# Patient Record
Sex: Male | Born: 1980 | Race: White | Hispanic: No
Health system: Southern US, Community
[De-identification: ages and names within clinical notes are randomized; demographics above are authoritative.]

---
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
## 2001-08-22 ENCOUNTER — Inpatient Hospital Stay (HOSPITAL_COMMUNITY): Admission: EM | Admit: 2001-08-22 | Discharge: 2001-08-24 | Payer: Self-pay | Admitting: Emergency Medicine

## 2001-08-22 ENCOUNTER — Encounter: Payer: Self-pay | Admitting: Emergency Medicine

## 2001-08-23 ENCOUNTER — Encounter: Payer: Self-pay | Admitting: Internal Medicine

## 2001-08-24 ENCOUNTER — Encounter: Payer: Self-pay | Admitting: Internal Medicine

## 2001-08-26 ENCOUNTER — Encounter: Admission: RE | Admit: 2001-08-26 | Discharge: 2001-08-26 | Payer: Self-pay | Admitting: Internal Medicine

## 2002-04-29 ENCOUNTER — Emergency Department (HOSPITAL_COMMUNITY): Admission: EM | Admit: 2002-04-29 | Discharge: 2002-04-29 | Payer: Self-pay | Admitting: Emergency Medicine

## 2003-09-04 ENCOUNTER — Emergency Department (HOSPITAL_COMMUNITY): Admission: AD | Admit: 2003-09-04 | Discharge: 2003-09-04 | Payer: Self-pay | Admitting: Emergency Medicine

## 2003-09-04 ENCOUNTER — Encounter: Payer: Self-pay | Admitting: Emergency Medicine

## 2004-06-28 ENCOUNTER — Emergency Department (HOSPITAL_COMMUNITY): Admission: EM | Admit: 2004-06-28 | Discharge: 2004-06-28 | Payer: Self-pay | Admitting: Emergency Medicine

## 2005-06-09 ENCOUNTER — Emergency Department (HOSPITAL_COMMUNITY): Admission: EM | Admit: 2005-06-09 | Discharge: 2005-06-09 | Payer: Self-pay | Admitting: Emergency Medicine

## 2005-06-13 ENCOUNTER — Emergency Department (HOSPITAL_COMMUNITY): Admission: EM | Admit: 2005-06-13 | Discharge: 2005-06-13 | Payer: Self-pay | Admitting: Emergency Medicine

## 2005-06-16 ENCOUNTER — Emergency Department (HOSPITAL_COMMUNITY): Admission: EM | Admit: 2005-06-16 | Discharge: 2005-06-16 | Payer: Self-pay | Admitting: Emergency Medicine

## 2005-09-01 ENCOUNTER — Emergency Department (HOSPITAL_COMMUNITY): Admission: EM | Admit: 2005-09-01 | Discharge: 2005-09-01 | Payer: Self-pay | Admitting: *Deleted

## 2006-07-31 ENCOUNTER — Emergency Department (HOSPITAL_COMMUNITY): Admission: EM | Admit: 2006-07-31 | Discharge: 2006-07-31 | Payer: Self-pay | Admitting: Emergency Medicine

## 2007-06-15 IMAGING — CR DG WRIST COMPLETE 3+V*R*
4 series · 4 of 4 positions shown · non-contrast
Comparison: none

CLINICAL DATA: Wrist laceration with bleeding right wrist.
 RIGHT WRIST ? 4 VIEWS:
 There is some soft tissue irregularity medially with overlying bandages compatible with the given history of laceration.  No foreign body, acute fracture, or dislocation is seen.

[x wrist pa right]
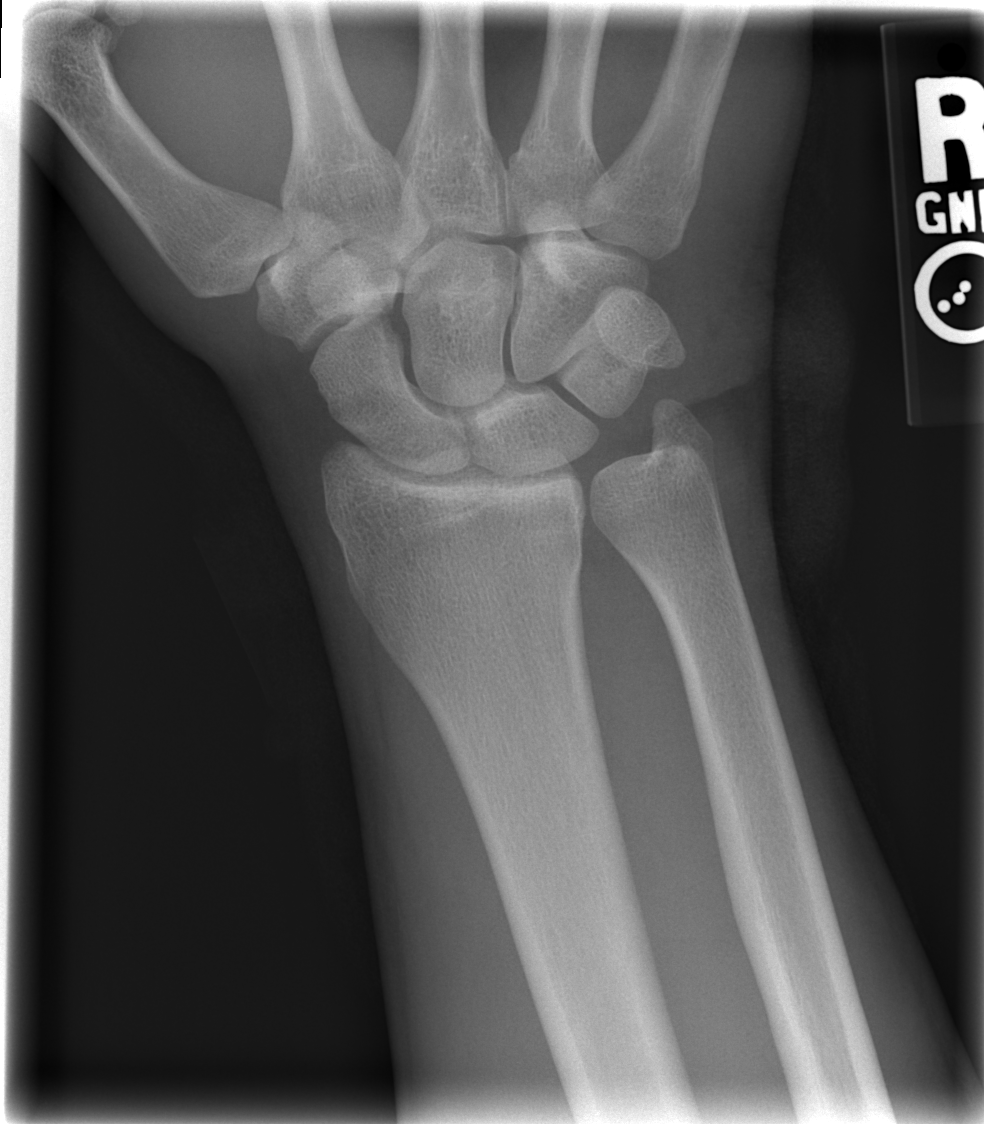

[x wrist obl right]
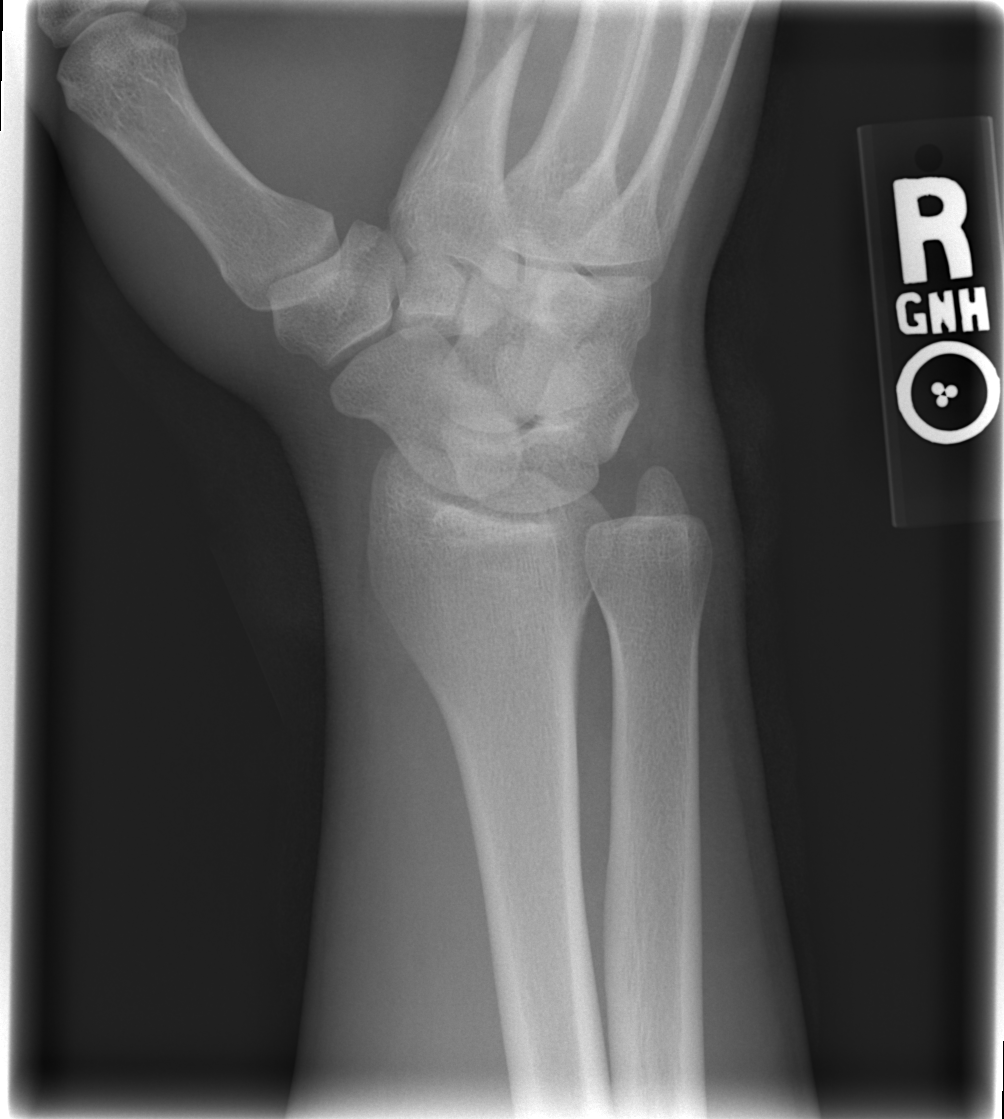

[x wrist lat right]
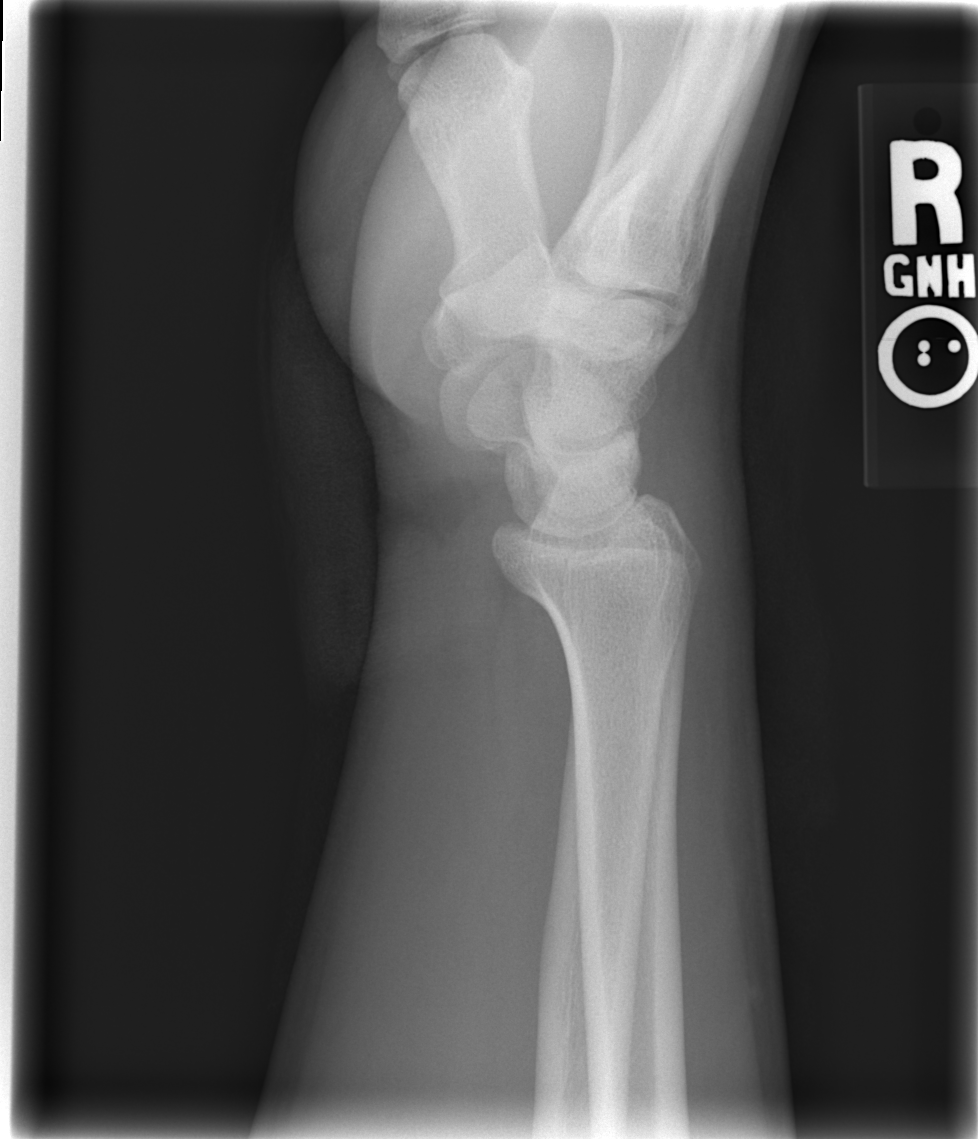

[x navicular]
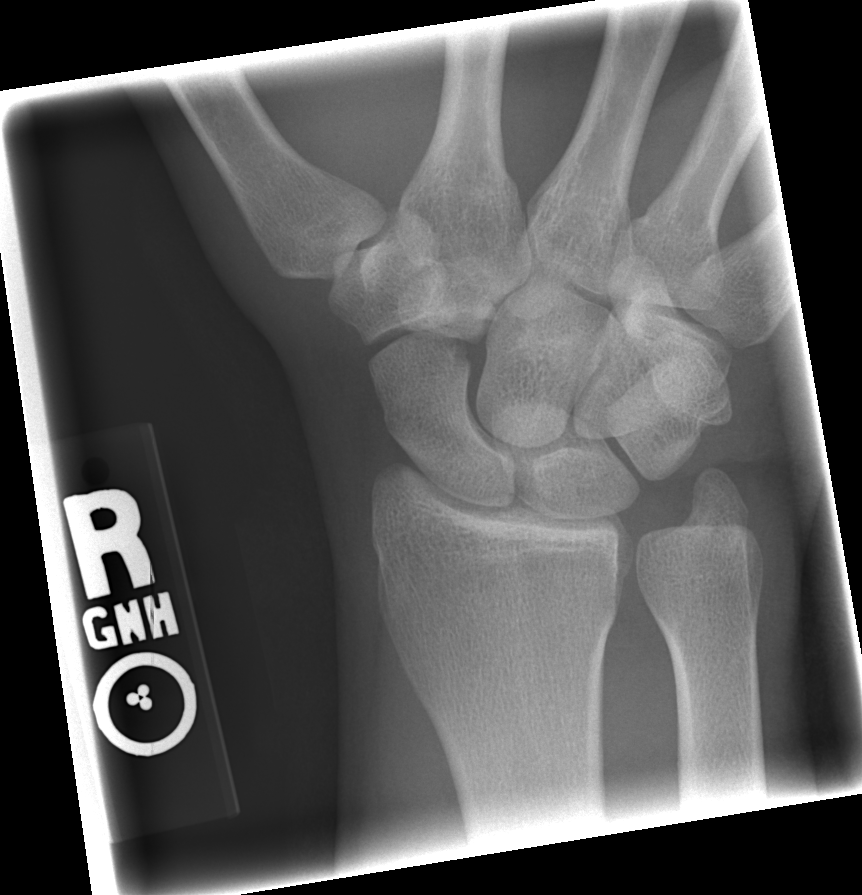

[4 of 4 positions shown; findings below may reference images not displayed]

IMPRESSION: No acute osseous findings or evidence of foreign body.
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
## 2018-04-11 NOTE — Progress Notes (Signed)
Functional Indep Measure Scores - Text       Functional Independence Measure Scores Entered On:  04/11/2018 14:41 EDT    Performed On:  04/11/2018 14:40 EDT by Kyung Rudd, RN, MANUVETTE E               Eating Score   Patient's Independence Level With Eating Tasks :   Setup/Supervision   Supervision or Setup Needed :   Cut food, Open containers, Pour liquid into containers, Prepare food for eating   Functional Independence Measure Eating :   Supervision or setup - 5   KENNEDY, RN, MANUVETTE E - 04/11/2018 14:40 EDT   Bladder Management Score   Patient's Independence Level with Bladder Management Tasks :   Assistance   Device/Techniques Utilized :   Catheter   Type of Assistance Necessary - Catheter :   Helper manages schedule   Functional Independence Measure Bladder Management :   Total assistance - 1   KENNEDY, RN, MANUVETTE E - 04/11/2018 14:40 EDT   Bowel Management Score   Patient's Independence Level with Bowel Management Tasks :   Assistance   Devices/Techniques Utilized :   Digital stimulation   Type of Assistance Necessary -   Digital Stimulation :   Helper changes linen or clothing/cleans up spill   Functional Independence Measure Bowel Management :   Total assistance - 1   KENNEDY, RN, MANUVETTE E - 04/11/2018 14:40 EDT   Admission Information   Swallowing Status on Admission :   Regular food   KENNEDY, RN, MANUVETTE E - 04/11/2018 14:41 EDT

## 2018-04-11 NOTE — Progress Notes (Signed)
Chaplaincy Note - Text       Chaplaincy Note Entered On:  04/11/2018 16:09 EDT    Performed On:  04/11/2018 16:09 EDT by Erenest Blank               Chaplaincy Consult   Faith/Denomination :   [facility]   Additional Information, Chaplaincy :   Attempted visit, pt not in room. Will f/u for spiritual assessment   Erenest Blank - 04/11/2018 16:09 EDT

## 2018-04-11 NOTE — Nursing Note (Signed)
Adult Patient History Form-Text       Adult Patient History Entered On:  04/11/2018 13:33 EDT    Performed On:  04/11/2018 13:30 EDT by Kyung Rudd, RN, [place]UVET[place] E               General Info   Patient Identified :   Identification band   Accompanied By :   Parent, Sibling   Pregnancy Status :   N/A   KENNEDY, RN, [place]UVET[place] E - 04/11/2018 13:30 EDT   ID Risk Screen Symptoms   Recent Travel History :   No recent travel   TB Symptom Screen :   No symptoms   C. diff Symptom/History ID :   Neither of the above   MRSA/VRE Screening :   Admitted to [facility]   [place], RN, [place] E - 04/11/2018 13:30 EDT   Bloodless Medicine   Will Patient Accept Blood Transfusion and/or Blood Products :   Yes   KENNEDY, RN, [place] E - 04/11/2018 13:30 EDT   Nutrition   Nutritional Risk Factors :   None   KENNEDY, RN, [place]ET[place] E - 04/11/2018 13:30 EDT   Spiritual   Faith/Denomination :   Baptist   Do you have a concern that you would like to address with a Chaplain? :   Yes   Do you have any religious/spiritual/cultural beliefs that could impact the way your care is provided? :   No   KENNEDY, RN, [place] E - 04/11/2018 13:30 EDT   Advance Directive   Advance Directive :   Yes   Patient Wishes to Receive Further Information on Advance Directives :   No   KENNEDY, RN, [place] E - 04/11/2018 13:30 EDT   Valuables and Belongings   DCP GENERIC CODE   At Bedside :   Cell phone, Clothes   KENNEDY, RN, [place] E - 04/11/2018 13:30 EDT   Admission Complete   Admission Complete :   Yes-unable to obtain further information   Kyung Rudd, RN, [place] E - 04/11/2018 13:30 EDT

## 2018-04-12 NOTE — Progress Notes (Signed)
Functional Indep Measure Scores - Text       Functional Independence Measure Scores Entered On:  04/12/2018 12:53 EDT    Performed On:  04/12/2018 8:30 EDT by Kurtis Bushman L               Eating Score   Patient's Independence Level With Eating Tasks :   Setup/Supervision   Supervision or Setup Needed :   Open containers   Functional Independence Measure Eating :   Supervision or setup - 5   [name] [name] - 04/12/2018 12:48 EDT   Grooming Score   Patient's Independence Level with Grooming Tasks :   Setup/Supervision   Supervision or Setup Needed :   Gather equipment   Grooming :   Supervision or setup - 5   [name] [name] - 04/12/2018 12:48 EDT   Bathing Score   Patient's Independence Level with Bathing Tasks :   Assistance   Type of Assistance Necessary :   Assistance with bathing body parts   Body Parts Assessed :   All body surfaces   Tasks Requiring Assistance :   Buttocks, Lower leg and foot, left, Lower leg and foot, right, Upper leg, left, Upper leg, right   Functional Independence Measure Bathing :   Moderate assistance - 3   [name] [name] - 04/12/2018 12:48 EDT   Upper Body Dressing Score   Patient's Independence Level with Upper Body Dressing Tasks :   Assistance   Type of Assistance Necessary :   Assistance with dressing tasks   Assess Donning/Doffing :   Assess donning tasks only   Tasks Assessed - Donning :   Right sleeve, Left sleeve, Pull head through neckline, Pull over trunk   Tasks Requiring Assistance - Donning :   Pull over trunk   UE Dressing :   Minimal contact assistance - 4   [name] [name] - 04/12/2018 12:48 EDT   Lower Body Dressing Score   Patient's independence Level with Lower Body Dressing Tasks :   Assistance   Type of Assistance Necessary :   Assistance with dressing tasks   Assess Donning/Doffing :   Assess donning tasks only   Tasks Assessed - Donning :   Underwear - right leg, Underwear - left leg, Underwear - pull on, Pants/skirt - right leg, Pants/skirt - left leg,  Pants/skirt - pull on, Socks - right, Socks - left   Tasks Requiring Assistance - Donning :   Underwear - right leg, Underwear - left leg, Underwear - pull on, Pants/skirt - right leg, Pants/skirt - left leg, Pants/skirt - pull on, Socks - right, Socks - left   LE Dressing :   Total assistance - 1   [name] [name] - 04/12/2018 12:48 EDT   Toileting Score   Patient's Independence Level with Toileting Tasks :   Assistance   Type of Assistance Necessary :   Assistance with toileting tasks   Amount of Assistance Needed :   Adjusting clothing before toileting, Adjusting clothing after toileting, Cleansing perineal area   Toileting :   Total assistance - 1   [name] [name] - 04/12/2018 12:48 EDT   Transfer Bed/Chair/WC Score   Patient's independence Level with Bed, Chair, Wheelchair Tasks :   Assistance   Type of Assistance Necessary :   Mechanical lift required, Total assist (patient performs less than 25%)   Bed, Chair, Wheelchair Transfer :   Total assistance - 1  [name] [name] - 04/12/2018 12:48 EDT   Transfer Toilet Score   Patient's Independence Level with Transfer Toilet Tasks :   Assistance   Amount of Assistance Necessary :   Total assist (patient performs less than 25%)   Transfer Toilet :   Total assistance - 1   [name] [name] - 04/12/2018 12:48 EDT   Comprehension Score   Comprehends Complex or Abstract Information Without Prompting or Cueing :   Yes   Mode of Comprehension :   Auditory   Understands Complex or Abstract Directions and Conversations :   At all times   Functional Independence Measure Comprehension :   Complete independence - 7   [name] [name] - 04/12/2018 12:48 EDT   Expression Score   Expresses Complex or Abstract Information Without Prompting or Cueing :   Yes   Expression Mode :   Vocal   Expresses Complex or Abstract Ideas :   Clearly and fluently at all times   Functional Independence Measure Expression :   Complete independence - 7   [name] [name] - 04/12/2018 12:48  EDT   Social Interaction Score   Interacts Appropriately Without Supervision :   Yes   Interacts Appropriately :   At all times   Functional Independence Measure Social Interaction :   Complete independence - 7   [name] [name] - 04/12/2018 12:48 EDT   Problem Solving Score   Solves Complex Problems :   Yes   Ability to Solve Complex Problems :   Makes decisions with mild difficulty   Functional Independence Measure Problem Solving :   Modified independence - 6   [name] [name] - 04/12/2018 12:48 EDT   Memory Score   Recognizes, Remembers Routines, and Executes Requests Without Prompting :   Yes   Remembers and Executes Requests :   Mild difficulty   Functional Independence Measure Memory :   Modified independence - 6   [name] [name] - 04/12/2018 12:48 EDT
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
## 2018-04-12 NOTE — Nursing Note (Signed)
Medication Administration Follow Up-Text       Medication Administration Follow Up Entered On:  04/12/2018 8:56 EDT    Performed On:  04/12/2018 8:55 EDT by Ottie Glazier, RN, Amy J      Intervention Information:     oxycodone  Performed by Ottie Glazier, RN, Amy J on 04/12/2018 07:56:00 EDT       oxycodone,5mg   Oral,moderate pain (4-7)       Med Response   ED Medication Response :   No adverse reaction   Numeric Rating Pain Scale :   2   Varnum, RN, Amy J - 04/12/2018 8:55 EDT
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
## 2018-04-12 NOTE — Progress Notes (Signed)
Functional Indep Measure Scores - Text       Functional Independence Measure Scores Entered On:  04/12/2018 16:53 EDT    Performed On:  04/12/2018 16:52 EDT by [name], PT, [name]               Transfer Bed/Chair/WC Score   Patient's independence Level with Bed, Chair, Wheelchair Tasks :   Assistance   Type of Assistance Necessary :   Two helpers needed, Total assist (patient performs less than 25%)   Bed, Chair, Wheelchair Transfer :   Total assistance - 1   [place], [place] - 04/12/2018 16:52 EDT   Walk/Wheelchair Score   Mode of Locomotion Goal :   Wheelchair   Type of Wheelchair Goal :   Manual wheelchair   Mode of Locomotion on Discharge :   Wheelchair   Patient Ambulates :   Does not occur   InM Walk Ambulation Score :   -101 ft   Does Not Occur Reason :   Patient cannot perform activity because of a medical condition or medical treatment   Functional Independence Measure Walk :   Does not occur   Assessed Locomotion in a Wheelchair :   Yes   Distance Traveled in a Wheelchair :   300 ft   Amount of Assistance Necessary :   Cueing, Standby supervision   Functional Independence Measure Wheelchair :   Supervision or setup - 5   [place], [place], [place] C - 04/12/2018 16:52 EDT   Stairs Score   Stairs Assessed :   Does not occur   Does Not Occur Reason :   Patient cannot perform activity because of a medical condition or medical treatment   Functional Independence Measure Stairs :   Does not occur   [place] - 04/12/2018 16:52 EDT

## 2018-04-12 NOTE — Progress Notes (Signed)
Functional Indep Measure Scores - Text       Functional Independence Measure Scores Entered On:  04/12/2018 1:25 EDT    Performed On:  04/12/2018 1:24 EDT by Jena Gauss, LPN, APRIL T               Eating Score   Patient's Independence Level With Eating Tasks :   Independent/Modified independence   Independent or Modifications Needed, Uses or Applies Independently :   Complete independence   Functional Independence Measure Eating :   Complete independence - 7   [place], LPN, APRIL T - 4/0/9811 1:24 EDT   Upper Body Dressing Score   Patient's Independence Level with Upper Body Dressing Tasks :   Assistance   Type of Assistance Necessary :   Requires assistance of 2 people   UE Dressing :   Total assistance - 1   Upper Body Dressing Score Comments :   Per PCT FIM Sheet completed by Unice Bailey, RN, CATHERINE - 04/22/2018 11:17 EDT   Toileting Score   Patient's Independence Level with Toileting Tasks :   Assistance   Type of Assistance Necessary :   Requires assistance of 2 people   Toileting :   Total assistance - 1   MAXWE[place], LPN, APRIL T - 08/10/4781 1:24 EDT   Bladder Management Score   Patient's Independence Level with Bladder Management Tasks :   Assistance   Device/Techniques Utilized :   Catheter   Type of Assistance Necessary - Catheter :   Total assist (patient performs less than 25%)   Functional Independence Measure Bladder Management :   Total assistance - 1   MAXWELL, LPN, APRIL T - 08/14/6212 1:24 EDT   Bowel Management Score   Patient's Independence Level with Bowel Management Tasks :   Assistance   Devices/Techniques Utilized :   Enema/Suppository   Type of Assistance Necessary - Enema/Suppository :   Helper changes linen or clothing/cleans up spill   Functional Independence Measure Bowel Management :   Total assistance - 1   MAXWELL, LPN, APRIL T - 0/07/6577 1:24 EDT   Transfer Bed/Chair/WC Score   Patient's independence Level with Bed, Chair, Wheelchair Tasks :   Assistance   Type of Assistance  Necessary :   Two helpers needed   Bed, Chair, Wheelchair Transfer :   Total assistance - 1   MAXWELL, LPN, APRIL T - 03/15/9628 1:24 EDT
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
## 2018-04-12 NOTE — Progress Notes (Signed)
Functional Indep Measure Scores - Text       Functional Independence Measure Scores Entered On:  04/12/2018 13:00 EDT    Performed On:  04/12/2018 12:59 EDT by [name], SLP, MAGGIE S               Comprehension Score   Comprehends Complex or Abstract Information Without Prompting or Cueing :   Yes   Mode of Comprehension :   Auditory   Understands Complex or Abstract Directions and Conversations :   Mild difficulty   Functional Independence Measure Comprehension :   Modified independence - 6   LEWIS, SLP, MAGGIE S - 04/12/2018 12:59 EDT   Expression Score   Expresses Complex or Abstract Information Without Prompting or Cueing :   Yes   Expression Mode :   Vocal   Expresses Complex or Abstract Ideas :   Mild difficulty   Functional Independence Measure Expression :   Modified independence - 6   LEWIS, SLP, MAGGIE S - 04/12/2018 12:59 EDT   Social Interaction Score   Interacts Appropriately Without Supervision :   Yes   Interacts Appropriately :   At all times   Functional Independence Measure Social Interaction :   Complete independence - 7   LEWIS, SLP, MAGGIE S - 04/12/2018 12:59 EDT   Problem Solving Score   Solves Complex Problems :   Yes   Ability to Solve Complex Problems :   Makes decisions with mild difficulty   Functional Independence Measure Problem Solving :   Modified independence - 6   LEWIS, SLP, MAGGIE S - 04/12/2018 12:59 EDT   Memory Score   Recognizes, Remembers Routines, and Executes Requests Without Prompting :   Yes   Remembers and Executes Requests :   Mild difficulty   Functional Independence Measure Memory :   Modified independence - 6   LEWIS, SLP, MAGGIE S - 04/12/2018 12:59 EDT

## 2018-04-12 NOTE — Progress Notes (Signed)
Functional Indep Measure Scores - Text       Functional Independence Measure Scores Entered On:  04/12/2018 9:05 EDT    Performed On:  04/12/2018 9:02 EDT by Ottie Glazier, RN, Amy J               Eating Score   Patient's Independence Level With Eating Tasks :   Setup/Supervision   Supervision or Setup Needed :   Prepare food for eating   Functional Independence Measure Eating :   Supervision or setup - 5   [place], RN, Amy J - 04/12/2018 9:02 EDT   Bladder Management Score   Patient's Independence Level with Bladder Management Tasks :   Assistance   Device/Techniques Utilized :   Catheter   Type of Assistance Necessary - Catheter :   Total assist (patient performs less than 25%)   Functional Independence Measure Bladder Management :   Total assistance - 1   [place], RN, Amy J - 04/12/2018 9:02 EDT   Bowel Management Score   Patient's Independence Level with Bowel Management Tasks :   Assistance   Devices/Techniques Utilized :   Enema/Suppository   Type of Assistance Necessary - Enema/Suppository :   Total assist (patient performs less than 25%)   Functional Independence Measure Bowel Management :   Total assistance - 1   [place], RN, Amy J - 04/12/2018 9:02 EDT   Transfer Bed/Chair/WC Score   Patient's independence Level with Bed, Chair, Wheelchair Tasks :   Assistance   Type of Assistance Necessary :   Two helpers needed   Bed, Chair, Wheelchair Transfer :   Total assistance - 1   [place], RN, Salomon Fick - 04/12/2018 9:02 EDT

## 2018-04-13 NOTE — Progress Notes (Signed)
Functional Indep Measure Scores - Text       Functional Independence Measure Scores Entered On:  04/13/2018 6:33 EDT    Performed On:  04/13/2018 1:00 EDT by Elmer Ramp, RN, MARIA NORLIS               Bladder Management Score   Patient's Independence Level with Bladder Management Tasks :   Assistance   Device/Techniques Utilized :   Catheter   Type of Assistance Necessary - Catheter :   Total assist (patient performs less than 25%)   Functional Independence Measure Bladder Management :   Total assistance - 1   SIOSON, RN, MARIA NORLIS - 04/13/2018 6:31 EDT   Bowel Management Score   Patient's Independence Level with Bowel Management Tasks :   Assistance   Devices/Techniques Utilized :   Digital stimulation   Type of Assistance Necessary -   Digital Stimulation :   Helper changes linen or clothing/cleans up spill   Functional Independence Measure Bowel Management :   Total assistance - 1   SIOSON, RN, MARIA NORLIS - 04/13/2018 6:31 EDT   Transfer Bed/Chair/WC Score   Patient's independence Level with Bed, Chair, Wheelchair Tasks :   Assistance   Type of Assistance Necessary :   Total assist (patient performs less than 25%)   Bed, Chair, Wheelchair Transfer :   Total assistance - 1   SIOSON, RN, MARIA NORLIS - 04/13/2018 6:31 EDT

## 2018-04-13 NOTE — Nursing Note (Signed)
Medication Administration Follow Up-Text       Medication Administration Follow Up Entered On:  04/13/2018 7:41 EDT    Performed On:  04/13/2018 7:41 EDT by Carnella Guadalajara, RN, Darl Pikes      Intervention Information:     oxycodone  Performed by Carnella Guadalajara RN, Darl Pikes on 04/12/2018 23:48:00 EDT       oxycodone,5mg   Oral,moderate pain (4-7)       Med Response   ED Medication Response :   No adverse reaction, Symptoms improved   Numeric Rating Pain Scale :   2   [name] [name] - 04/13/2018 7:41 EDT

## 2018-04-13 NOTE — Progress Notes (Signed)
Functional Indep Measure Scores - Text       Functional Independence Measure Scores Entered On:  04/13/2018 10:48 EDT    Performed On:  04/13/2018 10:47 EDT by Mayford Knife, RN, [place]               Eating Score   Patient's Independence Level With Eating Tasks :   Independent/Modified independence   Independent or Modifications Needed, Uses or Applies Independently :   Complete independence   Functional Independence Measure Eating :   Complete independence - 7   Mayford Knife RN, [place] - 04/13/2018 10:47 EDT   Bladder Management Score   Patient's Independence Level with Bladder Management Tasks :   Assistance   Device/Techniques Utilized :   Catheter   Type of Assistance Necessary - Catheter :   Total assist (patient performs less than 25%)   Functional Independence Measure Bladder Management :   Total assistance - 1   Mayford Knife RN, [place] - 04/13/2018 10:47 EDT   Bowel Management Score   Patient's Independence Level with Bowel Management Tasks :   Assistance   Devices/Techniques Utilized :   Enema/Suppository   Type of Assistance Necessary - Enema/Suppository :   Total assist (patient performs less than 25%)   Functional Independence Measure Bowel Management :   Total assistance - 1   Mayford Knife RN, [place] - 04/13/2018 10:47 EDT   Transfer Bed/Chair/WC Score   Patient's independence Level with Bed, Chair, Wheelchair Tasks :   Assistance   Type of Assistance Necessary :   Mechanical lift required   Bed, Chair, Wheelchair Transfer :   Total assistance - 1   Mena Goes - 04/13/2018 10:47 EDT   Transfer Toilet Score   Patient's Independence Level with Transfer Toilet Tasks :   Assistance   Amount of Assistance Necessary :   Mechanical lift required   Transfer Toilet :   Total assistance - 1   Mayford Knife RN, [place] - 04/13/2018 10:47 EDT

## 2018-04-13 NOTE — Progress Notes (Signed)
Functional Indep Measure Scores - Text       Functional Independence Measure Scores Entered On:  04/13/2018 22:49 EDT    Performed On:  04/13/2018 22:48 EDT by Jena Gauss, LPN, APRIL T               Eating Score   Patient's Independence Level With Eating Tasks :   Independent/Modified independence   Independent or Modifications Needed, Uses or Applies Independently :   Complete independence   Functional Independence Measure Eating :   Complete independence - 7   MAXWELL, LPN, APRIL T - 12/15/1094 22:48 EDT   Toileting Score   Patient's Independence Level with Toileting Tasks :   Assistance   Type of Assistance Necessary :   Requires assistance of 2 people   Toileting :   Total assistance - 1   MAXWELL, LPN, APRIL T - 0/03/5408 22:48 EDT   Bladder Management Score   Patient's Independence Level with Bladder Management Tasks :   Assistance   Device/Techniques Utilized :   Catheter   Type of Assistance Necessary - Catheter :   Total assist (patient performs less than 25%)   Functional Independence Measure Bladder Management :   Total assistance - 1   MAXWELL, LPN, APRIL T - 07/10/1913 22:48 EDT   Bowel Management Score   Patient's Independence Level with Bowel Management Tasks :   Assistance   Devices/Techniques Utilized :   Enema/Suppository   Type of Assistance Necessary - Enema/Suppository :   Helper changes linen or clothing/cleans up spill   Functional Independence Measure Bowel Management :   Total assistance - 1   MAXWELL, LPN, APRIL T - 06/17/2955 22:48 EDT   Transfer Bed/Chair/WC Score   Patient's independence Level with Bed, Chair, Wheelchair Tasks :   Assistance   Type of Assistance Necessary :   Two helpers needed, Mechanical lift required   Bed, Chair, Wheelchair Transfer :   Total assistance - 1   MAXWELL, LPN, APRIL T - 01/10/3085 22:48 EDT

## 2018-04-13 NOTE — Nursing Note (Signed)
Medication Administration Follow Up-Text       Medication Administration Follow Up Entered On:  04/13/2018 9:29 EDT    Performed On:  04/13/2018 7:08 EDT by Mayford Knife, RN, [place]      Intervention Information:     oxycodone  Performed by Carnella Gu[place], RN, Darl Pikes on 04/13/2018 06:08:00 EDT       oxycodone,5mg   Oral,moderate pain (4-7)       Med Response   ED Medication Response :   No adverse reaction, Symptoms improved   Numeric Rating Pain Scale :   0 = No pain   [place] Opioid Induced Sedation Scale :   1 = Awake and alert   Mena Goes - 04/13/2018 9:29 EDT
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
## 2018-04-13 NOTE — Progress Notes (Signed)
Functional Indep Measure Scores - Text       Functional Independence Measure Scores Entered On:  04/13/2018 16:22 EDT    Performed On:  04/13/2018 16:22 EDT by HOBBY, OT, LESLIE M               Transfer Bed/Chair/WC Score   Patient's independence Level with Bed, Chair, Wheelchair Tasks :   Assistance   Type of Assistance Necessary :   Two helpers needed   Bed, Chair, Wheelchair Transfer :   Total assistance - 1   HOBBY, OT, LESLIE M - 04/13/2018 16:22 EDT   Comprehension Score   Comprehends Complex or Abstract Information Without Prompting or Cueing :   Yes   Mode of Comprehension :   Auditory   Understands Complex or Abstract Directions and Conversations :   At all times   Functional Independence Measure Comprehension :   Complete independence - 7   HOBBY, OT, LESLIE M - 04/13/2018 16:22 EDT   Expression Score   Expresses Complex or Abstract Information Without Prompting or Cueing :   Yes   Expression Mode :   Vocal   Expresses Complex or Abstract Ideas :   Clearly and fluently at all times   Functional Independence Measure Expression :   Complete independence - 7   HOBBY, OT, LESLIE M - 04/13/2018 16:22 EDT   Social Interaction Score   Interacts Appropriately Without Supervision :   Yes   Interacts Appropriately :   At all times   Functional Independence Measure Social Interaction :   Complete independence - 7   HOBBY, OT, LESLIE M - 04/13/2018 16:22 EDT   Problem Solving Score   Solves Complex Problems :   Yes   Ability to Solve Complex Problems :   Consistently solves problems independently   Functional Independence Measure Problem Solving :   Complete independence - 7   HOBBY, OT, LESLIE M - 04/13/2018 16:22 EDT   Memory Score   Recognizes, Remembers Routines, and Executes Requests Without Prompting :   Yes   Remembers and Executes Requests :   Consistently without need for repetition   Functional Independence Measure Memory :   Complete independence - 7   HOBBY, OT, LESLIE M - 04/13/2018 16:22 EDT
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
## 2018-04-14 NOTE — Nursing Note (Signed)
Medication Administration Follow Up-Text       Medication Administration Follow Up Entered On:  04/14/2018 22:03 EDT    Performed On:  04/14/2018 22:03 EDT by Jena Gauss, LPN, APRIL T      Intervention Information:     tramadol  Performed by MAXWELL, LPN, APRIL T on 04/14/2018 20:45:00 EDT       tramadol,50mg   Oral,mild pain (1-3)       Med Response   ED Medication Response :   No adverse reaction, Symptoms improved   MAXWELL, LPN, APRIL T - 12/15/1094 22:03 EDT
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
## 2018-04-14 NOTE — Nursing Note (Signed)
Medication Administration Follow Up-Text       Medication Administration Follow Up Entered On:  04/14/2018 13:30 EDT    Performed On:  04/14/2018 13:30 EDT by Ottie Glazier, RN, Amy J      Intervention Information:     oxycodone  Performed by Ottie Glazier, RN, Amy J on 04/14/2018 13:09:00 EDT       oxycodone,5mg   Oral,moderate pain (4-7)       Med Response   ED Medication Response :   No adverse reaction   Numeric Rating Pain Scale :   2   Varnum, RN, Amy J - 04/14/2018 13:30 EDT

## 2018-04-14 NOTE — Progress Notes (Signed)
Functional Indep Measure Scores - Text       Functional Independence Measure Scores Entered On:  04/14/2018 23:49 EDT    Performed On:  04/14/2018 23:48 EDT by Jena Gauss, LPN, APRIL T               Eating Score   Patient's Independence Level With Eating Tasks :   Independent/Modified independence   Independent or Modifications Needed, Uses or Applies Independently :   Complete independence   Functional Independence Measure Eating :   Complete independence - 7   MAXWELL, LPN, APRIL T - 12/15/1094 23:48 EDT   Toileting Score   Patient's Independence Level with Toileting Tasks :   Assistance   Type of Assistance Necessary :   Requires assistance of 2 people   Toileting :   Total assistance - 1   MAXWELL, LPN, APRIL T - 0/03/5408 23:48 EDT   Bladder Management Score   Patient's Independence Level with Bladder Management Tasks :   Assistance   Device/Techniques Utilized :   Catheter   Type of Assistance Necessary - Catheter :   Total assist (patient performs less than 25%)   Functional Independence Measure Bladder Management :   Total assistance - 1   MAXWELL, LPN, APRIL T - 07/10/1913 23:48 EDT   Bowel Management Score   Patient's Independence Level with Bowel Management Tasks :   Assistance   Devices/Techniques Utilized :   Enema/Suppository   Type of Assistance Necessary - Enema/Suppository :   Helper changes linen or clothing/cleans up spill   Functional Independence Measure Bowel Management :   Total assistance - 1   MAXWELL, LPN, APRIL T - 06/17/2955 23:48 EDT   Transfer Bed/Chair/WC Score   Patient's independence Level with Bed, Chair, Wheelchair Tasks :   Assistance   Type of Assistance Necessary :   Two helpers needed, Mechanical lift required   Bed, Chair, Wheelchair Transfer :   Total assistance - 1   MAXWELL, LPN, APRIL T - 01/10/3085 23:48 EDT

## 2018-04-14 NOTE — Nursing Note (Signed)
Medication Administration Follow Up-Text       Medication Administration Follow Up Entered On:  04/14/2018 8:51 EDT    Performed On:  04/14/2018 8:51 EDT by Ottie Glazier, RN, Amy J      Intervention Information:     oxycodone  Performed by Ottie Glazier, RN, Amy J on 04/14/2018 08:06:00 EDT       oxycodone,5mg   Oral,moderate pain (4-7)       Med Response   ED Medication Response :   No adverse reaction   Ottie Glazier, RN, Amy J - 04/14/2018 8:51 EDT

## 2018-04-14 NOTE — Nursing Note (Signed)
Medication Administration Follow Up-Text       Medication Administration Follow Up Entered On:  04/14/2018 18:20 EDT    Performed On:  04/14/2018 18:20 EDT by Ottie Glazier, RN, Amy J      Intervention Information:     oxycodone  Performed by Ottie Glazier, RN, Amy J on 04/14/2018 17:58:00 EDT       oxycodone,5mg   Oral,moderate pain (4-7)       Med Response   ED Medication Response :   No adverse reaction   Numeric Rating Pain Scale :   2   Varnum, RN, Amy J - 04/14/2018 18:20 EDT

## 2018-04-14 NOTE — Progress Notes (Signed)
Functional Indep Measure Scores - Text       Functional Independence Measure Scores Entered On:  04/14/2018 8:53 EDT    Performed On:  04/14/2018 8:51 EDT by Ottie Glazier, RN, Amy J               Eating Score   Patient's Independence Level With Eating Tasks :   Independent/Modified independence   Independent or Modifications Needed, Uses or Applies Independently :   Complete independence   Functional Independence Measure Eating :   Complete independence - 7   Varnum, RN, Amy J - 04/14/2018 8:51 EDT   Bladder Management Score   Patient's Independence Level with Bladder Management Tasks :   Assistance   Device/Techniques Utilized :   Catheter   Type of Assistance Necessary - Catheter :   Total assist (patient performs less than 25%)   Functional Independence Measure Bladder Management :   Total assistance - 1   Varnum, RN, Amy J - 04/14/2018 8:51 EDT   Bowel Management Score   Patient's Independence Level with Bowel Management Tasks :   Assistance   Devices/Techniques Utilized :   Enema/Suppository   Type of Assistance Necessary - Enema/Suppository :   Total assist (patient performs less than 25%)   Functional Independence Measure Bowel Management :   Total assistance - 1   Varnum, RN, Amy J - 04/14/2018 8:51 EDT   Transfer Bed/Chair/WC Score   Patient's independence Level with Bed, Chair, Wheelchair Tasks :   Assistance   Type of Assistance Necessary :   Mechanical lift required   Bed, Chair, Wheelchair Transfer :   Total assistance - 1   Varnum, RN, Salomon Fick - 04/14/2018 8:51 EDT

## 2018-04-14 NOTE — Progress Notes (Signed)
Functional Indep Measure Scores - Text       Functional Independence Measure Scores Entered On:  04/14/2018 12:06 EDT    Performed On:  04/14/2018 12:05 EDT by [name]               Toileting Score   Patient's Independence Level with Toileting Tasks :   Assistance   Type of Assistance Necessary :   Assistance with toileting tasks   Amount of Assistance Needed :   Adjusting clothing before toileting, Cleansing perineal area   Toileting :   Maximal assistance - 2   [name] - 04/14/2018 12:05 EDT   Comprehension Score   Comprehends Complex or Abstract Information Without Prompting or Cueing :   Yes   Mode of Comprehension :   Auditory   Understands Complex or Abstract Directions and Conversations :   At all times   Functional Independence Measure Comprehension :   Complete independence - 7   [name] - 04/14/2018 12:05 EDT   Expression Score   Expresses Complex or Abstract Information Without Prompting or Cueing :   Yes   Expression Mode :   Vocal   Expresses Complex or Abstract Ideas :   Clearly and fluently at all times   Functional Independence Measure Expression :   Complete independence - 7   [name] - 04/14/2018 12:05 EDT   Social Interaction Score   Interacts Appropriately Without Supervision :   Yes   Interacts Appropriately :   At all times   Functional Independence Measure Social Interaction :   Complete independence - 7   [name] - 04/14/2018 12:05 EDT   Problem Solving Score   Solves Complex Problems :   Yes   Ability to Solve Complex Problems :   Consistently solves problems independently   Functional Independence Measure Problem Solving :   Complete independence - 7   [name] - 04/14/2018 12:05 EDT   Memory Score   Memory Score Comments :   FIMs reviewed by [name], [name]   [name], OT, [name] - 04/14/2018 14:12 EDT   Recognizes, Remembers Routines, and Executes Requests Without Prompting :   Yes   Remembers and Executes Requests :   Mild difficulty    Functional Independence Measure Memory :   Modified independence - 6   [name] - 04/14/2018 12:05 EDT
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
## 2018-04-14 NOTE — Progress Notes (Signed)
 Interdisciplinary Team Conference - Text       Interdisciplinary Team Conference PF Entered On:  04/14/2018 13:38 EDT    Performed On:  04/15/2018 12:33 EDT by DERRICK,  KATHRYN T               Team Members   Care Manager :   GEROME ROCK RAMAN   Nurse :   KATHLYNE CHARMAINE PARAS   Occupational Therapist :   [name]   Physical Therapist :   LORELLA KLEIN, AMBER R   Rehab Physician :   CASIMIR ALM PARAS LORELLA, PT, AMBER R - 04/15/2018 12:47 EDT   Primary Recreational Therapist :   GEORJEAN SUZEN RAMAN     Primary OT :   [name]     Primary Care Manager :   GEROME ROCK RAMAN     Primary Nurse :   Fermin, RN, [place] RAMAN     Primary PT :   LORELLA PT, AMBER R     Primary SLP :   RAYNALDO SLP, HERON LITTIE Fermin, RN, Jan S - 04/15/2018 12:42 EDT     Team Conference Date :   04/15/2018 EDT   LORELLA, PT, AMBER R - 04/15/2018 12:47 EDT       Care Management Summary.   Type of Conference :   Interim     Discharge Plan :   home w/parents & bro in [facility] area;  1 level, threshold step to enter   [place], Charity fundraiser, Jan S - 04/15/2018 12:42 EDT     Nursing Summary.   Nursing Team Notes Current :   Yes   Fermin PEAK, Jan S - 04/15/2018 12:48 EDT   Bowel Level of Assistance Interim :   Total assistance   Bowel Management :   Incontinent   Bowel Program :   Suppository   Bowel Movement Last Date :   04/15/2018 EDT   Bladder Level of Assistance Interim :   Supervision or setup   Bladder Management :   Incontinent   Urinary Elimination Management :   Intermittent straight catheter   Fermin RN, Jan S - 04/15/2018 12:32 EDT   Tracheostomy Information :   No qualifying data available.       Fermin, RN, Jan S - 04/15/2018 12:48 EDT     Number Times Suctioned This Shift :   0    Progress Achieved This Week :   bowel program late this am? changed time with pharmacy to 0500 as ordered. peg a supp/dig stim. self cath. with some assist. continues with cipro for UTI. wocn visited and addressed  multiple wound care. pain controlled with  oxycodone 5mg . on lovenox/ASA   Fermin, RN, Jan S - 04/15/2018 12:32 EDT   OT Basic ADL   Basic ADL Grid   Eating :   Supervision or setup     Grooming :   Supervision or setup     Bathing :   Moderate assistance     UE Dressing :   Minimal contact assistance     LE Dressing :   Total assistance     Toileting :   Total assistance     Transfer Toilet :   Total assistance     Tub Transfer :   Does not occur     Shower Transfer :   Does not occur   Fermin, RN, Jan S -  04/15/2018 12:42 EDT     ADL Comments :   5/6- Incontinent BM occured during session supine on mat. Pt max A sliding board t/f EOM >w/c > EOB. Pt tot A for toileting A supine in bed but aided in L/R log roll using bed rails.        OT ADL Reviewed :   Chaney Mattock, RN, Jan S - 04/15/2018 12:42 EDT     OT Short Term Goals.   Upper Body Dressing Short Term Goal Grid     Goal #1          Activity :    Don/Doff pull over shirt Mattock, RN, Jan S - 04/15/2018 12:42 EDT              Assist :    Sharia Mattock, RN, Jan S - 04/15/2018 12:42 EDT              Status :    Progressing, continue Mattock, RN, Jan S - 04/15/2018 12:42 EDT                DARRIN, OT, ELIZABETH G - 04/15/2018 11:40 EDT         Lower Body Dressing Grid     Goal #1  Goal #2        Activity :    Don/Doff pants, Don/Doff underwear Mattock RN, Jan S - 04/15/2018 12:42 EDT   Don/Doff shoes, Don/Doff socks Mattock RN, Jan S - 04/15/2018 12:42 EDT           Assist :    Moderate assistance Mattock RN, Jan S - 04/15/2018 12:42 EDT   Moderate assistance Mattock RN, Jan S - 04/15/2018 12:42 EDT           Status :    Progressing, continue Mattock, RN, Jan S - 04/15/2018 12:42 EDT   Progressing, continue Mattock, RN, Jan S - 04/15/2018 12:42 EDT             DARRIN, OT, ALMARIE G - 04/15/2018 11:40 EDT  DARRIN GILLIE ALMARIE G - 04/15/2018 11:40 EDT        Bathing Goal Grid     Goal #1          Activity :    Sponge lettie Mattock, RN, Jan S - 04/15/2018 12:42 EDT              Assist :    Minimal assistance Mattock RN, Jan S - 04/15/2018 12:42  EDT              Status :    Initial Mattock, RN, Jan S - 04/15/2018 12:42 EDT                DARRIN GILLIE ALMARIE G - 04/15/2018 11:40 EDT         Toileting and Transfers Goal Grid     Goal #1          Activity :    Bladder care Mattock RN, Jan S - 04/15/2018 12:42 EDT              Assist :    Minimal assistance Mattock RN, Jan S - 04/15/2018 12:42 EDT              Equipment :    Other: Catheter Mattock, RN, Jan S - 04/15/2018 12:42 EDT              Status :    Initial Mattock,  RN, Jan S - 04/15/2018 12:42 EDT                DARRIN, OT, ALMARIE MATSU - 04/15/2018 11:40 EDT         OT Balance Goal Grid     Goal #1          Type :    Dynamic sitting balance Fermin RN, Jan S - 04/15/2018 12:42 EDT              Assist :    Minimal assistance Fermin, RN, Jan S - 04/15/2018 12:42 EDT              Length of Time (minutes) :    5 minutes Fermin RN, Jan S - 04/15/2018 12:42 EDT              Rationale :    Improve independence with activities of daily living [place], [place], Jan S - 04/15/2018 12:42 EDT              Status :    Progressing, continue Fermin RN, Jan S - 04/15/2018 12:42 EDT                DARRIN GILLIE ALMARIE MATSU - 04/15/2018 11:40 EDT         OT STG Reviewed BETHA Chaney Fermin, RN, Jan S - 04/15/2018 12:42 EDT     OT Long Term Goals.   OT IP Long Term Goals Grid     Long Term Goal 1          Goal :    Pt will complete fxl mobility task propelling w/c > 10 mins around hospital to incre endurance/ community eve Fermin, RN, Jan S - 04/15/2018 12:42 EDT              Status :    Initial Fermin, RN, Jan S - 04/15/2018 12:42 EDT                DARRIN, OT, ALMARIE MATSU - 04/15/2018 11:40 EDT         OT ADL FIM Long Term Goals   Eating Goal :   Complete independence - 7     Grooming Goal :   Complete independence - 7     Bathing Goal :   Supervision or setup - 5     Upper Extremity Dressing Goal :   Modified independence - 6     Lower Body Dressing Goal :   Supervision or setup - 5     Toileting Goal :   Supervision or setup - 5     Toilet Transfer Goal :    Minimal contact assistance - 4   Fermin RN, Jan S - 04/15/2018 12:42 EDT     OT Long Term Goals Reviewed :   Chaney Fermin, RN, Jan S - 04/15/2018 12:42 EDT     Occupational Therapy Summary.   Barriers to Safe Discharge OT :   Complicated medical history, Insight into deficits, Safety awareness, Severity of deficits     Additional Comments DME OT :   Bowel and bladder have limited pt's progress.     OT Team Notes Current :   Yes   Fermin RN, Jan S - 04/15/2018 12:42 EDT     PT Mobility   Mobility Grid   Roll Left :   Rehab Maximal assistance     Roll Right :   Rehab Maximal  assistance     Roll Supine :   Rehab Moderate assistance     Supine to Sit :   Rehab Maximal assistance     Sit to Supine :   Rehab Maximal assistance     Scooting :   Rehab Total assistance     Sit to Stand :   Does not occur     Stand to Sit :   Does not occur     Transfer Bed to and From Chair :   Rehab Total assistance     Transfer Toilet :   Total assistance - 1   Fermin RN, Jan S - 04/15/2018 12:42 EDT     Amb Ability Varied Surf/Distraction Grid   Level Surfaces :   Does not occur   Fermin, RN, Jan S - 04/15/2018 12:42 EDT     Transfer Type :   Transfer board     PT Mobility Reviewed :   Chaney Fermin, RN, Jan S - 04/15/2018 12:42 EDT     PT WC Management   Type of Wheelchair :   Manual wheelchair     Wheelchair Details :   cues for proper propulsion technique in ultralight w/c for shoulder preservation, will need further practice. Edu on lateral pressure reliefs with good return demo. Pt uncomfortable w/ forward lean pressure reliefs 2* dec balance   Fermin RN, Jan S - 04/15/2018 12:42 EDT     Wheelchair Mobility Grid   Level Surfaces :   Close supervision   Fermin PEAK, Jan S - 04/15/2018 12:42 EDT     Wheelchair Mobility Level Distance :   300 ft     Wheelchair Mobility Reviewed :   Chaney Fermin, RN, Jan S - 04/15/2018 12:42 EDT     PT Short Term Goals.   Bed Mobility Goal Grid     Goal #1  Goal #2  Goal #3      Descriptors :    Supine to sit Fermin,  RN, Jan S - 04/15/2018 12:42 EDT   Sit to supine Fermin, RN, Jan S - 04/15/2018 12:42 EDT   Roll to right and left Fermin, RN, Jan S - 04/15/2018 12:42 EDT        Level :    Moderate assistance Fermin, RN, Jan S - 04/15/2018 12:42 EDT   Moderate assistance Fermin RN, Jan S - 04/15/2018 12:42 EDT   Close supervision Fermin RN, Jan S - 04/15/2018 12:42 EDT        Status :    Progressing, continue Fermin RN, Jan S - 04/15/2018 12:42 EDT   Progressing, continue Fermin, RN, Jan S - 04/15/2018 12:42 EDT   Progressing, continue Fermin, RN, Jan S - 04/15/2018 12:42 EDT          LORELLA, PT, AMBER R - 04/15/2018 11:57 EDT  LORELLA, PT, AMBER R - 04/15/2018 11:57 EDT  LORELLA, PT, AMBER R - 04/15/2018 11:57 EDT       Transfers Goal Grid     Goal #1          Descriptors :    Transfer board Fermin, RN, Jan S - 04/15/2018 12:42 EDT              Level :    Maximal assistance Fermin RN, Jan S - 04/15/2018 12:42 EDT              Device :    Transfer board Fermin, RN, Jan S -  04/15/2018 12:42 EDT              Status :    Progressing, continue Fermin RN, Jan S - 04/15/2018 12:42 EDT                LORELLA, PT, AMBER R - 04/15/2018 11:57 EDT         W/C Management Grid     Goal #1  Goal #2        Descriptors :    Mobility with manual wheelchair Fermin, RN, Jan S - 04/15/2018 12:42 EDT   Pressure relief forward lean Fermin RN, Jan S - 04/15/2018 12:42 EDT           Level :    Modified independence Fermin RN, Jan S - 04/15/2018 12:42 EDT   Distant supervision Fermin, RN, Jan S - 04/15/2018 12:42 EDT           Distance :    300 ft Fermin, [place], Jan S - 04/15/2018 12:42 EDT              Status :    Progressing, continue Fermin RN, Jan S - 04/15/2018 12:42 EDT   Progressing, continue Fermin, RN, Jan S - 04/15/2018 12:42 EDT             LORELLA, PT, AMBER R - 04/15/2018 11:57 EDT  LORELLA, PT, AMBER R - 04/15/2018 11:57 EDT        PT Balance Goal Grid     Goal #1  Goal #2        Descriptor :    Supported short sit Fermin, RN, Jan S - 04/15/2018 12:42 EDT   Unsupported short sit Fermin PEAK, Jan  S - 04/15/2018 12:42 EDT           Assist Level :    Modified independence Fermin PEAK, Jan S - 04/15/2018 12:42 EDT   Close supervision Fermin, RN, Jan S - 04/15/2018 12:42 EDT           Type :    Static sitting Fermin RN, Jan S - 04/15/2018 12:42 EDT   Static sitting Fermin RN, Jan S - 04/15/2018 12:42 EDT           Length of Time (minutes) :    5 minutes Fermin RN, Jan S - 04/15/2018 12:42 EDT   5 minutes Fermin PEAK, Jan S - 04/15/2018 12:42 EDT           Status :    Progressing, continue Fermin RN, Jan S - 04/15/2018 12:42 EDT   Progressing, continue Fermin, RN, Jan S - 04/15/2018 12:42 EDT             LORELLA, PT, AMBER R - 04/15/2018 11:57 EDT  LORELLA, PT, AMBER R - 04/15/2018 11:57 EDT        PT STG Reviewed :   Chaney Fermin, RN, Jan S - 04/15/2018 12:42 EDT     PT Long Term Goals.   Outpatient PT Long Term Goals Rehab     Long Term Goal 1          Goal :    Pt will improve SCIM mobility to 15/40. Fermin, RN, Jan S - 04/15/2018 12:42 EDT              Status :    Initial Fermin, RN, Jan S - 04/15/2018 12:42 EDT  BENTON, PT, AMBER R - 04/15/2018 11:57 EDT         DCP GENERIC CODE   Bed, Chair, Wheelchair Goal :   Modified independence - 6     Wheelchair Mobility Level Surfaces Goal :   Modified independence - 6   Fermin, RN, Jan S - 04/15/2018 12:42 EDT     Type of Wheelchair Goal :   Manual wheelchair     Mode of Locomotion Goal :   Wheelchair   [place], RN, Jan S - 04/15/2018 12:42 EDT     Physical Therapy Summary.   PT Equipment Anticipated or Recommended :   Ultralightweight Manual Wheelchair     PT Team Notes Current :   Yes   Fermin, RN, Jan S - 04/15/2018 12:42 EDT     Severity Level.   Comprehension Indep Measure Interim :   Complete independence - 7     Comprehension Mode :   Auditory     Expression Indep Measure Interim :   Complete independence - 7     Expression Mode :   Vocal     Problem Solving Indep Measure Interim :   Modified independence - 6     Memory Indep Measure Interim :   Modified independence - 6     Social  Interaction Indep Measure Interim :   Complete independence - 7   Fermin, RN, Jan S - 04/15/2018 12:42 EDT     SLP Short Term Goals.   SLP STG Reviewed :   Yes   BENTON, PT, AMBER R - 04/15/2018 12:47 EDT   Speech Therapy Summary.   Barriers to Safe Discharge SLP :   Severity of deficits   SLP Progress Note Current :   Yes   Additional Comments SLP Summary :   PRN SLP EVALUATED PT. AND HE SCORED 24/30 MOCA 7/1. SLP SIGNED OFF ON THIS CASE.   LORELLA, PT, AMBER R - 04/15/2018 12:47 EDT   Psychology Summary.   Psychology Progress Notes Current :   Yes   BENTON, PT, AMBER R - 04/15/2018 12:47 EDT   Recreational Therapy Progress Note   Patient Recreational Therapy Goals :   pt goal: Get back to driving and work.   Barriers- endurance, sitting balance, adjustment, adaptive skills   Goals: education, resources, community reentry/ barriers, adaptive leisure skills      Fermin PEAK, Jan S - 04/15/2018 12:42 EDT     Interdisciplinary Discharge Planning.   Team Conference Discussion RTF :   Jeoffrey LORELLA, PT served as scribe during team conference this date.      BENTON, PT, AMBER R - 04/15/2018 12:47 EDT   Rehab Anticipated Discharge Date :   05/01/2018 EDT   Next Level of Care :   Home Health   Barriers to goals/Reasons for skilled intervention :   Decreased sitting/standing balance, Decreased strength/ROM, Pain management issues, Skin integrity issues   Medical/rehab reason for continued stay :   Bowel/bladder, Brady/tachycardia, Community mobility issue, Electrolyte imbalance, Medication adjustments, Orthostatic hypotension, Pain management, Paresis, Pending labs/diagnostics, Skin integrity, Unsafe discharge environment, Wound   BENTON, PT, AMBER R - 04/15/2018 12:46 EDT   Anticipated Therapy Interventions.   PT Estimated Hours per Week :   7.5 hours per week   BENTON, PT, AMBER R - 04/15/2018 12:46 EDT   Therapy Activity Tolerance :   15 hours over 7 days   BENTON, PT, AMBER R - 04/15/2018 12:49 EDT     OT  Estimated Hours Per Week :   7.5  hours per week   BENTON, PT, AMBER R - 04/15/2018 12:46 EDT   Rehab Team Goals   Team Mobility Goals Grid     Goal #1          Team Mobility Goals :    Toilet transfers              Team Mobility Assist Level :    With max assist              Team Mobility Goal Status :    Ongoing BENTON, PT, AMBER R - 04/15/2018 12:49 EDT                LORELLA, PT, AMBER R - 04/15/2018 12:46 EDT         Team Mobility Goals 2 Grid     Goal #1          Team Mobility Goals :    Bed and or wheelchair transfer              Team Mobility Assist Level :    With max assist              Team Mobility Goal Status :    Ongoing BENTON, PT, AMBER R - 04/15/2018 12:49 EDT                LORELLA, PT, AMBER R - 04/15/2018 12:46 EDT         Team Pain Management Goals Grid     Goal #1          Team Pain Management Goals :    Acute pain control              Team Pain Management Goals Status :    Ongoing BENTON, PT, AMBER R - 04/15/2018 12:49 EDT                LORELLA, PT, AMBER R - 04/15/2018 12:46 EDT         Team Patient/Family Ed Goal Grid     Goal #1          Team Patient/Family Education Goals :    Participate in family teaching/education              Team Patient/Family Edu Goal Status :    Ongoing BENTON, PT, AMBER R - 04/15/2018 12:49 EDT                LORELLA, PT, AMBER R - 04/15/2018 12:46 EDT

## 2018-04-14 NOTE — Progress Notes (Signed)
Interdisciplinary Team Conference - Text       Interdisciplinary Team Conference PF Entered On:  04/14/2018 11:11 EDT    Performed On:  04/14/2018 11:11 EDT by Jean Rosenthal, MSW, Melissa R               Team Members   Nurse :   Grant Ruts   Rehab Physician :   Volney Presser   Team Conference Date :   04/14/2018 EDT   Glenna Durand A - 04/14/2018 12:10 EDT   Primary PT :   Eliseo Gum PT, AMBER Clemon Chambers, PT, AMBER R - 04/14/2018 12:08 EDT   Care Manager :   Jean Rosenthal, MSW, Lia Foyer   Occupational Therapist :   Roby Lofts   Physical Therapist :   Honor Loh,  Tori A - 04/14/2018 12:07 EDT   Primary SLP :   Hilda Blades SLP, Blenda Bridegroom, SLP, Denice Bors - 04/14/2018 11:22 EDT   Primary Nurse :   Ottie Glazier RN, Amy Ninfa Linden, RN, Amy J - 04/14/2018 11:17 EDT   Primary Care Manager :   Veronda Prude, MSW, Melissa R - 04/14/2018 11:11 EDT   Care Management Summary.   Type of Conference :   Initial   Jean Rosenthal, MSW, Lia Foyer - 04/14/2018 11:11 EDT   Nursing Summary.   Bowel Level of Assistance Interim :   Complete independence   Bowel Management :   Incontinent   Bowel Program :   Stool softener, Suppository   Bowel Movement Last Date :   04/13/2018 EDT   Bladder Level of Assistance Interim :   Complete independence   Bladder Management :   Continent   Urinary Elimination Management :   Intermittent straight catheter   Tracheostomy Information :   No qualifying data available.       Progress Achieved This Week :   pt in good spirts. ready to learn   Nursing Team Notes Current :   Yes   Ottie Glazier, RN, Salomon Fick - 04/14/2018 11:17 EDT   OT Basic ADL   Basic ADL Grid   Eating :   Supervision or setup   Grooming :   Supervision or setup   Bathing :   Moderate assistance   UE Dressing :   Minimal contact assistance   LE Dressing :   Total assistance   Toileting :   Total assistance   Transfer Toilet :   Total assistance   Tub Transfer :   Does not occur   Shower Transfer :   Does not occur   [name],  OT, ELIZABETH G - 04/14/2018 12:08 EDT   ADL Comments :   Eval ADL 12/15/08:  Eating/ grooming: set up, bed level   Bathing: mod A, completed bed level, pt able to bathe UB; required assist to bathe lower body and buttocks,   UE Dress: min A, pt required assist to don over trunk, able to don TLSO c set up  LE Dress/ Toileting/ Toilet t/f: Total A      OT ADL Reviewed :   Verne Spurr, OT, ELIZABETH G - 04/14/2018 12:08 EDT   OT Short Term Goals.   Upper Body Dressing Short Term Goal Grid     Goal #1          Activity :    Don/Doff pull over  shirt              Assist :    Setup              Status :    Progressing, continue                [name], OT, ELIZABETH G - 04/14/2018 12:08 EDT         Lower Body Dressing Grid     Goal #1  Goal #2        Activity :    Don/Doff pants, Don/Doff underwear   Don/Doff shoes, Don/Doff socks           Assist :    Moderate assistance   Moderate assistance           Status :    Progressing, continue   Progressing, continue             [name], OT, ELIZABETH G - 04/14/2018 12:08 EDT  [name], OT, ELIZABETH G - 04/14/2018 12:08 EDT        Bathing Goal Grid     Goal #1          Activity :    Sponge bath              Assist :    Minimal assistance              Status :    Initial                [name], OT, ELIZABETH G - 04/14/2018 12:08 EDT         OT Balance Goal Grid     Goal #1          Type :    Dynamic sitting balance              Assist :    Minimal assistance              Length of Time (minutes) :    5 minutes              Rationale :    Improve independence with activities of daily living              Status :    Progressing, continue                Marzella Schlein, OT, ELIZABETH G - 04/14/2018 12:08 EDT         OT STG Reviewed :   Verne Spurr, OT, ELIZABETH G - 04/14/2018 12:08 EDT   OT Long Term Goals.   OT IP Long Term Goals Grid     Long Term Goal 1          Goal :    Pt will complete fxl mobility task propelling w/c > 10 mins around hospital to incre endurance/ community moiblity              Status :     Initial                [name], OT, ELIZABETH G - 04/14/2018 12:08 EDT         OT ADL FIM Long Term Goals   Eating Goal :   Complete independence - 7   Grooming Goal :   Complete independence - 7   Bathing Goal :   Supervision or setup - 5   Upper Extremity Dressing Goal :   Modified independence - 6  Lower Body Dressing Goal :   Supervision or setup - 5   Toileting Goal :   Supervision or setup - 5   Toilet Transfer Goal :   Minimal contact assistance - 4   Marzella Schlein, OT, ELIZABETH G - 04/14/2018 12:08 EDT   OT Long Term Goals Reviewed :   Verne Spurr, OT, ELIZABETH G - 04/14/2018 12:08 EDT   Occupational Therapy Summary.   Barriers to Safe Discharge OT :   Safety awareness   Additional Comments DME OT :   3 weeks to improve ADLS   OT Team Notes Current :   Yes   [name], OT, ELIZABETH G - 04/14/2018 12:08 EDT   PT Mobility   Mobility Grid   Roll Left :   Rehab Maximal assistance   Roll Right :   Rehab Maximal assistance   Roll Supine :   Rehab Moderate assistance   Supine to Sit :   Rehab Maximal assistance   Sit to Supine :   Rehab Maximal assistance   Scooting :   Rehab Total assistance   Sit to Stand :   Does not occur   Stand to Sit :   Does not occur   Transfer Bed to and From Chair :   Rehab Total assistance   Transfer Toilet :   Total assistance - 1   BENTON, PT, AMBER R - 04/14/2018 12:08 EDT   Functional Mobility Details :   NSG staff instructed to use hoyer lift. SB txf TA 2/2 NWB RUE and dec sitting balance   BENTON, PT, AMBER R - 04/14/2018 12:08 EDT   Amb Ability Varied Surf/Distraction Grid   Level Surfaces :   Does not occur   BENTON, PT, AMBER R - 04/14/2018 12:08 EDT   Transfer Type :   Transfer board   PT Mobility Reviewed :   Yes   BENTON, PT, AMBER R - 04/14/2018 12:08 EDT   PT WC Management   Type of Wheelchair :   Manual wheelchair   Wheelchair Details :   cues for proper propulsion technique in ultralight w/c for shoulder preservation, will need further practice. Edu on lateral pressure reliefs with good  return demo. Pt uncomfortable w/ forward lean pressure reliefs 2* dec balance   BENTON, PT, AMBER R - 04/14/2018 12:08 EDT   Wheelchair Mobility Grid   Level Surfaces :   Close supervision   BENTON, PT, AMBER R - 04/14/2018 12:08 EDT   Wheelchair Mobility Level Distance :   300 ft   Wheelchair Mobility Reviewed :   Yes   BENTON, PT, AMBER R - 04/14/2018 12:08 EDT   PT Short Term Goals.   Bed Mobility Goal Grid     Goal #1  Goal #2  Goal #3      Descriptors :    Supine to sit   Sit to supine   Roll to right and left        Level :    Moderate assistance   Moderate assistance   Close supervision        Status :    Initial   Initial   Initial          BENTON, PT, AMBER R - 04/14/2018 12:08 EDT  BENTON, PT, AMBER R - 04/14/2018 12:08 EDT  BENTON, PT, AMBER R - 04/14/2018 12:08 EDT       Transfers Goal Grid     Goal #1  Descriptors :    Transfer board              Level :    Maximal assistance              Device :    Transfer board              Status :    Initial                BENTON, PT, AMBER R - 04/14/2018 12:08 EDT         W/C Management Grid     Goal #1  Goal #2        Descriptors :    Mobility with manual wheelchair   Pressure relief forward lean           Level :    Modified independence   Distant supervision           Distance :    300 ft              Status :    Initial   Initial             BENTON, PT, AMBER R - 04/14/2018 12:08 EDT  BENTON, PT, AMBER R - 04/14/2018 12:08 EDT        PT Balance Goal Grid     Goal #1  Goal #2        Descriptor :    Supported short sit   Unsupported short sit           Assist Level :    Modified independence   Close supervision           Type :    Static sitting   Static sitting           Length of Time (minutes) :    5 minutes   5 minutes           Status :    Initial   Initial             BENTON, PT, AMBER R - 04/14/2018 12:08 EDT  Eliseo Gum, PT, AMBER R - 04/14/2018 12:08 EDT        PT STG Reviewed :   Yes   BENTON, PT, AMBER R - 04/14/2018 12:08 EDT   PT Long Term Goals.   Outpatient PT Long  Term Goals Rehab     Long Term Goal 1          Goal :    Pt will improve SCIM mobility to 15/40.              Status :    Initial                BENTON, PT, AMBER R - 04/14/2018 12:08 EDT         DCP GENERIC CODE   Bed, Chair, Wheelchair Goal :   Modified independence - 6   Wheelchair Mobility Level Surfaces Goal :   Modified independence - 6   BENTON, PT, AMBER R - 04/14/2018 12:08 EDT   Type of Wheelchair Goal :   Manual wheelchair   Mode of Locomotion Goal :   Wheelchair   Ashley, PT, AMBER R - 04/14/2018 12:08 EDT   Physical Therapy Summary.   PT Equipment Anticipated or Recommended :   Engineer, structural   Additional Comments DME PT :   ELOS 3 weeks if pt able to WB through  RUE soon. Otherwise staged admission once WB status changes. can pt WB through elbow?. Pt highly motivated. SCIM (mobility)=7/40.   PT Team Notes Current :   Yes   BENTON, PT, AMBER R - 04/14/2018 12:08 EDT   Severity Level.   Comprehension Indep Measure Interim :   Complete independence - 7   Comprehension Mode :   Auditory   Expression Indep Measure Interim :   Complete independence - 7   Expression Mode :   Vocal   Problem Solving Indep Measure Interim :   Modified independence - 6   Memory Indep Measure Interim :   Modified independence - 6   Social Interaction Indep Measure Interim :   Complete independence - 7   ARMSTRONG, SLP, BARBARA L - 04/14/2018 11:22 EDT   SLP Short Term Goals.   SLP STG Reviewed :   Yes   ARMSTRONG, SLP, BARBARA L - 04/14/2018 11:22 EDT   SLP Long Term Goals.   SLP Patient, Caregiver Goals :   maximize indep   ARMSTRONG, SLP, BARBARA L - 04/14/2018 11:22 EDT   Speech Therapy Summary.   Barriers to Safe Discharge SLP :   Severity of deficits   SLP Progress Note Current :   Yes   Additional Comments SLP Summary :   PRN SLP EVALUATED PT. AND HE SCORED 24/30 MOCA 7/1. SLP SIGNED OFF ON THIS CASE.   ARMSTRONG, SLP, BARBARA L - 04/14/2018 11:22 EDT   Psychology Summary.   Psychology Progress Notes Current :   N/A    Iona Beard - 04/14/2018 12:10 EDT   Interdisciplinary Discharge Planning.   Medical/rehab reason for continued stay :   Bowel/bladder, Brady/tachycardia, Community mobility issue, Electrolyte imbalance, Medication adjustments, Orthostatic hypotension, Pain management, Paresis, Pending labs/diagnostics, Skin integrity, Unsafe discharge environment, Wound   Lowell Guitar, MD, Elmon Else - 04/14/2018 12:45 EDT   Rehab Anticipated Discharge Date :   05/01/2018 EDT   Next Level of Care :   Home Health   Team Conference Members :   Attending physician, Care manager, Nurse, Occupational therapist, Physical therapist   Barriers to goals/Reasons for skilled intervention :   Decreased sitting/standing balance, Decreased strength/ROM, Pain management issues, Skin integrity issues   Iona Beard - 04/14/2018 12:10 EDT   Team Conference Discussion RTF :   Glenna Durand, PT, DPT served as scribe     Mandrell,  Tori A - 04/14/2018 12:07 EDT   Anticipated Therapy Interventions.   PT Estimated Hours per Week :   7.5 hours per week   Therapy Activity Tolerance :   3 hours over 5 days   OT Estimated Hours Per Week :   7.5 hours per week   Glenna Durand A - 04/14/2018 12:07 EDT   Rehab Team Goals   Team Mobility Goals Grid     Goal #1          Team Mobility Goals :    Toilet transfers              Team Mobility Assist Level :    With max assist              Team Mobility Goal Status :    Initial                Glenna Durand A - 04/14/2018 12:10 EDT         Team Mobility Goals 2 Grid     Goal #  1          Team Mobility Goals :    Bed and or wheelchair transfer              Team Mobility Assist Level :    With max assist              Team Mobility Goal Status :    Initial                Glenna Durand A - 04/14/2018 12:10 EDT         Team Pain Management Goals Grid     Goal #1          Team Pain Management Goals :    Acute pain control              Team Pain Management Goals Status :    Initial                Glenna Durand A - 04/14/2018 12:10  EDT         Team Patient/Family Ed Goal Grid     Goal #1          Team Patient/Family Education Goals :    Participate in family teaching/education              Team Patient/Family Edu Goal Status :    Initial                Mandrell,  Tori A - 04/14/2018 12:10 EDT
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
## 2018-04-15 NOTE — Progress Notes (Signed)
Functional Indep Measure Scores - Text       Functional Independence Measure Scores Entered On:  04/15/2018 15:14 EDT    Performed On:  04/15/2018 15:14 EDT by [name]               Comprehension Score   Comprehends Complex or Abstract Information Without Prompting or Cueing :   Yes   Mode of Comprehension :   Auditory   Understands Complex or Abstract Directions and Conversations :   Mild difficulty   Functional Independence Measure Comprehension :   Modified independence - 6   [name] - 04/15/2018 15:14 EDT   Expression Score   Expresses Complex or Abstract Information Without Prompting or Cueing :   Yes   Expression Mode :   Vocal   Expresses Complex or Abstract Ideas :   Clearly and fluently at all times   Functional Independence Measure Expression :   Complete independence - 7   [name] - 04/15/2018 15:14 EDT   Social Interaction Score   Interacts Appropriately Without Supervision :   Yes   Interacts Appropriately :   At all times   Functional Independence Measure Social Interaction :   Complete independence - 7   [name] - 04/15/2018 15:14 EDT   Problem Solving Score   Solves Complex Problems :   Yes   Ability to Solve Complex Problems :   Makes decisions with mild difficulty   Functional Independence Measure Problem Solving :   Modified independence - 6   [name] - 04/15/2018 15:14 EDT   Memory Score   Memory Score Comments :   FIMs reviewed by [name], [name]   [name], OT, [name] - 04/15/2018 15:37 EDT   Recognizes, Remembers Routines, and Executes Requests Without Prompting :   Yes   Remembers and Executes Requests :   Consistently without need for repetition   Functional Independence Measure Memory :   Complete independence - 7   [name] - 04/15/2018 15:14 EDT

## 2018-04-15 NOTE — Nursing Note (Signed)
Medication Administration Follow Up-Text       Medication Administration Follow Up Entered On:  04/15/2018 18:58 EDT    Performed On:  04/15/2018 12:00 EDT by Esmeralda Arthur, RN, Jan S      Intervention Information:     oxycodone  Performed by Esmeralda Arthur, RN, Jan S on 04/15/2018 10:39:00 EDT       oxycodone,5mg   Oral,moderate pain (4-7)       Med Response   ED Medication Response :   No adverse reaction   Esmeralda Arthur RN, Jan S - 04/15/2018 18:58 EDT
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
## 2018-04-15 NOTE — Progress Notes (Signed)
OT Time Spent With Patient - Text       OT Time Spent With Patient Entered On:  04/15/2018 10:16 EDT    Performed On:  04/15/2018 10:14 EDT by Marzella Schlein, OT, ELIZABETH G               Time Spent With Patient   OT Individual Eval Time, Low Complexity :   0 minutes   OT Assisting Personnel Total Time :   0 minutes   OT Total Individual Therapy Time Rehab :   0    OT Treatment Time Comment :   Bowel program was not performed at 6AM, started during OT's therapy time.  OT to attempt in PM.     OT Total Untimed Code Treatment Minutes Rehab :   0    OT Total Treatment Time Rehab :   0 minutes   [name], OT, ELIZABETH G - 04/15/2018 10:14 EDT   OT Units Cancelled Missed     OT Units Lost #1          Amount :    6               Reason :    RN/MD procedure                [name], OT, ELIZABETH G - 04/15/2018 10:14 EDT         OT Minutes Cancelled Missed Grid     OT Minutes Lost #1          Amount :    90               Reason :    RN/MD procedure                [name], OT, ELIZABETH G - 04/15/2018 10:14 EDT

## 2018-04-16 NOTE — Progress Notes (Signed)
Functional Indep Measure Scores - Text       Functional Independence Measure Scores Entered On:  04/16/2018 12:35 EDT    Performed On:  04/16/2018 12:34 EDT by [name] [name]               Comprehension Score   Comprehends Complex or Abstract Information Without Prompting or Cueing :   Yes   Mode of Comprehension :   Auditory   Understands Complex or Abstract Directions and Conversations :   At all times   Functional Independence Measure Comprehension :   Complete independence - 7   [name] [name] - 04/16/2018 12:34 EDT   Expression Score   Expresses Complex or Abstract Information Without Prompting or Cueing :   Yes   Expression Mode :   Vocal   Expresses Complex or Abstract Ideas :   Clearly and fluently at all times   Functional Independence Measure Expression :   Complete independence - 7   [name] [name] - 04/16/2018 12:34 EDT   Social Interaction Score   Interacts Appropriately Without Supervision :   Yes   Interacts Appropriately :   At all times   Functional Independence Measure Social Interaction :   Complete independence - 7   [name] [name] - 04/16/2018 12:34 EDT   Problem Solving Score   Solves Complex Problems :   Yes   Ability to Solve Complex Problems :   Consistently solves problems independently   Functional Independence Measure Problem Solving :   Complete independence - 7   [name] [name] - 04/16/2018 12:34 EDT   Memory Score   Memory Score Comments :   FIMs reveiwed by [name], [name]   [name], OT, [name] - 04/16/2018 15:45 EDT   Recognizes, Remembers Routines, and Executes Requests Without Prompting :   Yes   Remembers and Executes Requests :   Consistently without need for repetition   Functional Independence Measure Memory :   Complete independence - 7   [name] [name] - 04/16/2018 12:34 EDT

## 2018-04-16 NOTE — Nursing Note (Signed)
Medication Administration Follow Up-Text       Medication Administration Follow Up Entered On:  04/16/2018 10:49 EDT    Performed On:  04/16/2018 10:48 EDT by Raul Del, RN, Crystal N      Intervention Information:     ondansetron  Performed by Raul Del, RN, Crystal N on 04/16/2018 09:34:00 EDT       ondansetron,4mg   Oral,nausea/vomiting       Med Response   ED Medication Response :   Symptoms improved   Raul Del, RN, Crystal N - 04/16/2018 10:48 EDT
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
## 2018-04-16 NOTE — Progress Notes (Signed)
Functional Indep Measure Scores - Text       Functional Independence Measure Scores Entered On:  04/16/2018 10:58 EDT    Performed On:  04/16/2018 10:54 EDT by Raul Del, RN, Crystal N               Eating Score   Patient's Independence Level With Eating Tasks :   Independent/Modified independence   Independent or Modifications Needed, Uses or Applies Independently :   Complete independence   Functional Independence Measure Eating :   Complete independence - 7   Raul Del, RN, Crystal N - 04/16/2018 10:54 EDT   Toileting Score   Patient's Independence Level with Toileting Tasks :   Assistance   Type of Assistance Necessary :   Requires assistance of 2 people   Toileting :   Total assistance - 1   Raul Del, RN, [employer] N - 04/16/2018 10:54 EDT   Bladder Management Score   Patient's Independence Level with Bladder Management Tasks :   Assistance   Device/Techniques Utilized :   Pull-Up/Brief/Pad   Type of Assistance Necessary - Pull-Up/Brief/Pad :   Helper does the majority of the work of managing the brief   Functional Independence Measure Bladder Management :   Maximal assistance - 2   Raul Del, RN, Crystal N - 04/16/2018 10:54 EDT   Bowel Management Score   Patient's Independence Level with Bowel Management Tasks :   Assistance   Devices/Techniques Utilized :   Digital stimulation   Type of Assistance Necessary -   Digital Stimulation :   Helper changes linen or clothing/cleans up spill   Functional Independence Measure Bowel Management :   Total assistance - 1   Raul Del, RN, Crystal N - 04/16/2018 10:54 EDT   Transfer Bed/Chair/WC Score   Patient's independence Level with Bed, Chair, Wheelchair Tasks :   Assistance   Type of Assistance Necessary :   Two helpers needed, Mechanical lift required, Total assist (patient performs less than 25%)   Bed, Chair, Wheelchair Transfer :   Total assistance - 1   Sanda Linger - 04/16/2018 10:54 EDT   Transfer Toilet Score   Patient's Independence Level with Transfer Toilet Tasks :   Assistance    Amount of Assistance Necessary :   Two helpers needed, Mechanical lift required, Total assist (patient performs less than 25%)   Transfer Toilet :   Total assistance - 1   Raul Del, RN, [employer] - 04/16/2018 10:54 EDT
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
## 2018-04-16 NOTE — Nursing Note (Signed)
Medication Administration Follow Up-Text       Medication Administration Follow Up Entered On:  04/16/2018 9:33 EDT    Performed On:  04/16/2018 9:32 EDT by Raul Del, RN, Crystal N      Intervention Information:     oxycodone  Performed by Raul Del, RN, Crystal N on 04/16/2018 08:28:00 EDT       oxycodone,5mg   Oral,moderate pain (4-7)       Med Response   ED Medication Response :   Symptoms improved   Numeric Rating Pain Scale :   0 = No pain   Pasero Opioid Induced Sedation Scale :   1 = Awake and alert   Respiratory Rate :   17 br/min   Raul Del, RN, Crystal N - 04/16/2018 9:32 EDT

## 2018-04-16 NOTE — Nursing Note (Signed)
Medication Administration Follow Up-Text       Medication Administration Follow Up Entered On:  04/16/2018 22:39 EDT    Performed On:  04/16/2018 20:01 EDT by Lynford Humphrey, RN, [place]      Intervention Information:     oxycodone  Performed by Raul Del, RN, Crystal N on 04/16/2018 19:01:00 EDT       oxycodone,5mg   Oral,moderate pain (4-7)       Med Response   ED Medication Response :   No adverse reaction   Numeric Rating Pain Scale :   6   Kennedy Bucker - 04/16/2018 22:38 EDT

## 2018-04-16 NOTE — Nursing Note (Signed)
Medication Administration Follow Up-Text       Medication Administration Follow Up Entered On:  04/16/2018 0:54 EDT    Performed On:  04/16/2018 0:54 EDT by Mason Jim, RN, Shawntay      Intervention Information:     tramadol  Performed by Mason Jim, RN, Shawntay on 04/15/2018 20:54:00 EDT       tramadol,50mg   Oral,mild pain (1-3)       Med Response   ED Medication Response :   No adverse reaction   Numeric Rating Pain Scale :   0 = No pain   Fraser Din - 04/16/2018 0:54 EDT

## 2018-04-16 NOTE — Progress Notes (Signed)
Functional Indep Measure Scores - Text       Functional Independence Measure Scores Entered On:  04/16/2018 12:32 EDT    Performed On:  04/16/2018 10:00 EDT by Deboraha Sprang               Transfer Bed/Chair/WC Score   Patient's independence Level with Bed, Chair, Wheelchair Tasks :   Assistance   Type of Assistance Necessary :   Assistance for more than half (patient performs 25% - 49% of tasks), Total assist (patient performs less than 25%)   Bed, Chair, Wheelchair Transfer :   Total assistance - 1   Deboraha Sprang 04/16/2018 12:31 EDT

## 2018-04-17 NOTE — Progress Notes (Signed)
Functional Indep Measure Scores - Text       Functional Independence Measure Scores Entered On:  04/17/2018 11:00 EDT    Performed On:  04/17/2018 11:00 EDT by Raul Del, RN, Crystal N               Eating Score   Patient's Independence Level With Eating Tasks :   Independent/Modified independence   Independent or Modifications Needed, Uses or Applies Independently :   Complete independence   Functional Independence Measure Eating :   Complete independence - 7   Raul Del, RN, Crystal N - 04/17/2018 11:00 EDT   Toileting Score   Patient's Independence Level with Toileting Tasks :   Assistance   Type of Assistance Necessary :   Requires assistance of 2 people   Toileting :   Total assistance - 1   Raul Del, RN, [employer] N - 04/17/2018 11:00 EDT   Bladder Management Score   Patient's Independence Level with Bladder Management Tasks :   Assistance   Device/Techniques Utilized :   Catheter   Type of Assistance Necessary - Catheter :   Helper manages schedule   Functional Independence Measure Bladder Management :   Total assistance - 1   Raul Del, RN, Crystal N - 04/17/2018 11:00 EDT   Bowel Management Score   Patient's Independence Level with Bowel Management Tasks :   Assistance   Devices/Techniques Utilized :   Digital stimulation   Type of Assistance Necessary -   Digital Stimulation :   Helper changes linen or clothing/cleans up spill   Functional Independence Measure Bowel Management :   Total assistance - 1   Raul Del, RN, Crystal N - 04/17/2018 11:00 EDT   Transfer Bed/Chair/WC Score   Patient's independence Level with Bed, Chair, Wheelchair Tasks :   Assistance   Type of Assistance Necessary :   Two helpers needed, Mechanical lift required, Total assist (patient performs less than 25%)   Bed, Chair, Wheelchair Transfer :   Total assistance - 1   Raul Del, RN, [employer] N - 04/17/2018 11:00 EDT   Transfer Toilet Score   Patient's Independence Level with Transfer Toilet Tasks :   Assistance   Amount of Assistance Necessary :   Two helpers  needed, Mechanical lift required, Total assist (patient performs less than 25%)   Transfer Toilet :   Total assistance - 1   Raul Del, RN, [employer] N - 04/17/2018 11:00 EDT
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
## 2018-04-17 NOTE — Progress Notes (Signed)
Functional Indep Measure Scores - Text       Functional Independence Measure Scores Entered On:  04/17/2018 12:06 EDT    Performed On:  04/17/2018 12:05 EDT by [name] [name]               Grooming Score   Patient's Independence Level with Grooming Tasks :   Independent/Modified independence   Independent or Modifications Needed, Uses or Applies Independently :   Complete independence   Grooming :   Complete independence - 7   [name] [name] - 04/17/2018 12:05 EDT   Lower Body Dressing Score   Patient's independence Level with Lower Body Dressing Tasks :   Assistance   Type of Assistance Necessary :   Assistance with dressing tasks   Assess Donning/Doffing :   Assess donning tasks only   Tasks Assessed - Donning :   Underwear - right leg, Underwear - left leg, Underwear - pull on, Pants/skirt - right leg, Pants/skirt - left leg, Pants/skirt - pull on   Tasks Requiring Assistance - Donning :   Underwear - pull on, Pants/skirt - pull on   LE Dressing :   Moderate assistance - 3   [name] [name] - 04/17/2018 14:58 EDT   Comprehension Score   Comprehends Complex or Abstract Information Without Prompting or Cueing :   Yes   Mode of Comprehension :   Auditory   Understands Complex or Abstract Directions and Conversations :   At all times   Functional Independence Measure Comprehension :   Complete independence - 7   [name] [name] - 04/17/2018 12:05 EDT   Expression Score   Expresses Complex or Abstract Information Without Prompting or Cueing :   Yes   Expression Mode :   Vocal   Expresses Complex or Abstract Ideas :   Clearly and fluently at all times   Functional Independence Measure Expression :   Complete independence - 7   [name] [name] - 04/17/2018 12:05 EDT   Social Interaction Score   Interacts Appropriately Without Supervision :   Yes   Interacts Appropriately :   At all times   Functional Independence Measure Social Interaction :   Complete independence - 7   [name] [name] - 04/17/2018 12:05 EDT   Problem Solving Score    Solves Complex Problems :   Yes   Ability to Solve Complex Problems :   Consistently solves problems independently   Functional Independence Measure Problem Solving :   Complete independence - 7   [name] [name] - 04/17/2018 12:05 EDT   Memory Score   Memory Score Comments :   FIM reviewed by [name], [name]   [name], OT, [name] - 04/17/2018 15:21 EDT   Recognizes, Remembers Routines, and Executes Requests Without Prompting :   Yes   Remembers and Executes Requests :   Consistently without need for repetition   Functional Independence Measure Memory :   Complete independence - 7   [name] [name] - 04/17/2018 12:05 EDT

## 2018-04-17 NOTE — Progress Notes (Signed)
Therapeutic Recreation Progress - Text       Therapeutic Recreation Progress Note Entered On:  04/17/2018 15:49 EDT    Performed On:  04/17/2018 15:46 EDT by Tory Emerald               Progress Note   Leisure Skill Activity #4     Leisure Skill Activity #1  Leisure Skill Activity #2        Therapeutic Focus :    Activity modification, Adaptive equipment training   Activity endurance, Sitting balance, Sitting tolerance           Level of Assistance :    Modified independence   Modified independence           Sessions Needed :    2 TX session   3 TX session             Tory Emerald - 04/17/2018 15:46 EDT  Tory Emerald - 04/17/2018 15:46 EDT        Community Reintegration Grid     Community Reintegration Activity #1  Community Reintegration Activity #2        Therapeutic Focus :    Resources   Barriers, Problem solving, Wheelchair mobility           Level of Assistance :    Complete independence   Modified independence           Sessions Needed :    2 TX session   2 [place] session             Tory Emerald - 04/17/2018 15:46 EDT  Tory Emerald - 04/17/2018 15:46 EDT        Social Interaction Grid     Social Interaction Activity #1          Therapeutic Focus :    Social support, Other: adjustment                Tory Emerald - 04/17/2018 15:46 EDT         Leisure Participation Grid     Initiation Leisure Activity #1          Therapeutic Focus :    Activity pattern development at home              Level of Assistance :    Modified independence              Sessions Needed :    1 TX session                Tory Emerald - 04/17/2018 15:46 EDT         Summary   Actual Deficits :   Adjustment, Leisure awareness, LEU use, Sitting balance   Perceived Deficits :   Coping   Planned Treatments :   Adaptive skills, Adjustment, Barriers, Community reentry, Other: resources   Planned Frequency :   2-3 times per week   Planned Duration :   3-4 weeks   Overall Progress :   pt seen for 30 min. session to  address endurance, community situations and education. Pt taken outside to 5th floor garden- needing A 50% distance 2* UE endurance and limited WB status on RUE. Pt max A c ramp 2* WB status. Discussed bowel program and bladder education- pt encouraged to cont discussions with RN, MD and OT to manage issues. Pt reporting benefit from being outside- "I need  to be doing something- not used to being still." Continue to explore adaptive skills to build strength endurance and adapt.      Clydia Llano S - 04/17/2018 15:46 EDT   TR Charges   TR Time In :   11:00 EST   TR Time Out :   11:30 EST   TR Individualized Units :   2 units   TR Chart Total Treatment Time Units :   2 units   TR Daily Total Treatment Time Units :   2 units   Tory Emerald - 04/17/2018 15:46 EDT

## 2018-04-17 NOTE — Progress Notes (Signed)
Functional Indep Measure Scores - Text       Functional Independence Measure Scores Entered On:  04/17/2018 2:34 EDT    Performed On:  04/17/2018 2:28 EDT by Lynford Humphrey, RN, Rosezena Sensor B               Eating Score   Patient's Independence Level With Eating Tasks :   Setup/Supervision   Supervision or Setup Needed :   Observe for safety concerns, Open containers, Prepare food for eating   Functional Independence Measure Eating :   Supervision or setup - 5   Wannetta Sender B - 04/17/2018 2:28 EDT   Toileting Score   Patient's Independence Level with Toileting Tasks :   Assistance   Type of Assistance Necessary :   Requires assistance of 2 people   Toileting :   Total assistance - 1   Kennedy Bucker - 04/17/2018 2:28 EDT   Bladder Management Score   Patient's Independence Level with Bladder Management Tasks :   Assistance   Device/Techniques Utilized :   Catheter   Type of Assistance Necessary - Catheter :   More assist than touching (patient performs 50% - 74% of tasks)   Functional Independence Measure Bladder Management :   Moderate assistance - 3   Kennedy Bucker - 04/17/2018 2:28 EDT   Bowel Management Score   Patient's Independence Level with Bowel Management Tasks :   Assistance   Devices/Techniques Utilized :   Enema/Suppository   Type of Assistance Necessary - Enema/Suppository :   Total assist (patient performs less than 25%)   Functional Independence Measure Bowel Management :   Total assistance - 1   Lynford Humphrey, RN, [place] - 04/17/2018 2:28 EDT   Transfer Bed/Chair/WC Score   Patient's independence Level with Bed, Chair, Wheelchair Tasks :   Assistance   Type of Assistance Necessary :   Two helpers needed   Bed, Chair, Wheelchair Transfer :   Total assistance - 1   Kennedy Bucker - 04/17/2018 2:28 EDT   Transfer Toilet Score   Patient's Independence Level with Transfer Toilet Tasks :   Assistance   Amount of Assistance Necessary :   Two helpers needed   Transfer Toilet :   Total assistance - 1   [place], IT sales professional B - 04/17/2018 2:28 EDT

## 2018-04-17 NOTE — Nursing Note (Signed)
Medication Administration Follow Up-Text       Medication Administration Follow Up Entered On:  04/17/2018 10:05 EDT    Performed On:  04/17/2018 7:43 EDT by Raul Del, RN, Crystal N      Intervention Information:     oxycodone  Performed by Lynford Humphrey, RN, Kayon B on 04/17/2018 06:43:00 EDT       oxycodone,5mg   Oral,moderate pain (4-7)       Med Response   ED Medication Response :   Symptoms improved   Numeric Rating Pain Scale :   2   [place] Opioid Induced Sedation Scale :   S = Sleep, easy to arouse   Respiratory Rate :   18 br/min   Raul Del, RN, Crystal N - 04/17/2018 10:04 EDT

## 2018-04-18 NOTE — Progress Notes (Signed)
Functional Indep Measure Scores - Text       Functional Independence Measure Scores Entered On:  04/18/2018 12:59 EDT    Performed On:  04/18/2018 12:57 EDT by [name] Arthur, RN, [name] [name]               Eating Score   Patient'[name] Independence Level With Eating Tasks :   Setup/Supervision   Supervision or Setup Needed :   Prepare food for eating   Functional Independence Measure Eating :   Supervision or setup - 5   [name] Arthur, RN, [name] [name] - 04/18/2018 12:57 EDT   Toileting Score   Patient'[name] Independence Level with Toileting Tasks :   Does not occur   Does Not Occur Reason :   Patient cannot perform activity because of a medical condition or medical treatment   Toileting :   Does not occur   [name] Arthur, RN, [name] [name] - 04/18/2018 12:57 EDT   Bladder Management Score   Patient'[name] Independence Level with Bladder Management Tasks :   Setup/Supervision   Supervision or Setup Needed :   Empty device, Gather equipment, Setup equipment, Standby prompting   Functional Independence Measure Bladder Management :   Supervision or setup - 5   [name] Arthur RN, [name] [name] - 04/18/2018 12:57 EDT   Bowel Management Score   Patient'[name] Independence Level with Bowel Management Tasks :   Assistance   Devices/Techniques Utilized :   Digital stimulation   Type of Assistance Necessary -   Digital Stimulation :   Helper changes linen or clothing/cleans up spill   Functional Independence Measure Bowel Management :   Total assistance - 1   [name] Arthur RN, [name] [name] - 04/18/2018 12:57 EDT   Transfer Bed/Chair/WC Score   Patient'[name] independence Level with Bed, Chair, Wheelchair Tasks :   Assistance   Type of Assistance Necessary :   Mechanical lift required   Bed, Chair, Wheelchair Transfer :   Total assistance - 1   Garner Gavel, [name] [name] - 04/18/2018 12:57 EDT   Transfer Toilet Score   Patient'[name] Independence Level with Transfer Toilet Tasks :   Does not occur   Does Not Occur Reason :   Patient cannot perform activity because of a medical condition or medical treatment   Transfer Toilet :   Does not  occur   [name] Arthur, RN, [name] [name] - 04/18/2018 12:57 EDT   Transfer Shower Score   Patient'[name] independence Level with Transfer Shower Tasks :   Assistance   Type of Assistance Necessary :   Mechanical lift required   Shower Transfer :   Total assistance - 1   Garner Gavel, [name] [name] - 04/18/2018 12:57 EDT
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
## 2018-04-18 NOTE — Progress Notes (Signed)
Functional Indep Measure Scores - Text       Functional Independence Measure Scores Entered On:  04/18/2018 0:12 EDT    Performed On:  04/18/2018 0:11 EDT by Jena Gauss, LPN, APRIL T               Eating Score   Patient's Independence Level With Eating Tasks :   Independent/Modified independence   Independent or Modifications Needed, Uses or Applies Independently :   Complete independence   Functional Independence Measure Eating :   Complete independence - 7   MAXWELL, LPN, APRIL T - 1/61/0960 0:11 EDT   Toileting Score   Patient's Independence Level with Toileting Tasks :   Assistance   Type of Assistance Necessary :   Requires assistance of 2 people   Toileting :   Total assistance - 1   MAXWELL, LPN, APRIL T - 4/54/0981 0:11 EDT   Bladder Management Score   Patient's Independence Level with Bladder Management Tasks :   Assistance   Device/Techniques Utilized :   Catheter   Type of Assistance Necessary - Catheter :   No more help than touching (patient performs 75% or more of tasks)   Functional Independence Measure Bladder Management :   Minimal contact assistance - 4   MAXWELL, LPN, APRIL T - 1/91/4782 0:11 EDT   Bowel Management Score   Patient's Independence Level with Bowel Management Tasks :   Assistance   Devices/Techniques Utilized :   Enema/Suppository   Type of Assistance Necessary - Enema/Suppository :   Helper changes linen or clothing/cleans up spill   Functional Independence Measure Bowel Management :   Total assistance - 1   MAXWELL, LPN, APRIL T - 9/56/2130 0:11 EDT   Transfer Bed/Chair/WC Score   Patient's independence Level with Bed, Chair, Wheelchair Tasks :   Assistance   Type of Assistance Necessary :   Two helpers needed, Mechanical lift required   Bed, Chair, Wheelchair Transfer :   Total assistance - 1   MAXWELL, LPN, APRIL T - 8/65/7846 0:11 EDT

## 2018-04-18 NOTE — Progress Notes (Signed)
Functional Indep Measure Scores - Text       Functional Independence Measure Scores Entered On:  04/18/2018 11:59 EDT    Performed On:  04/18/2018 11:51 EDT by [name]               Grooming Score   Patient's Independence Level with Grooming Tasks :   Independent/Modified independence   Independent or Modifications Needed, Uses or Applies Independently :   Safety concerns   Grooming :   Modified independence - 6   [name], OT, ELIZABETH G - 04/18/2018 13:59 EDT   Bathing Score   Patient's Independence Level with Bathing Tasks :   Assistance   Type of Assistance Necessary :   Assistance with bathing body parts   Body Parts Assessed :   All body surfaces   [name] - 04/18/2018 11:51 EDT   Tasks Requiring Assistance :   Buttocks   Functional Independence Measure Bathing :   Minimal contact assistance - 4   [name] - 04/18/2018 12:09 EDT   Upper Body Dressing Score   Patient's Independence Level with Upper Body Dressing Tasks :   Setup/Supervision   Supervision or Setup Needed :   Gather clothes   UE Dressing :   Supervision or setup - 5   [name] - 04/18/2018 11:51 EDT   Lower Body Dressing Score   Patient's independence Level with Lower Body Dressing Tasks :   Assistance   Type of Assistance Necessary :   Assistance with dressing tasks   Assess Donning/Doffing :   Assess donning tasks only   Tasks Assessed - Donning :   Underwear - right leg, Underwear - left leg, Underwear - pull on, Pants/skirt - right leg, Pants/skirt - left leg, Pants/skirt - pull on   Tasks Requiring Assistance - Donning :   Underwear - pull on, Pants/skirt - pull on   LE Dressing :   Moderate assistance - 3   [name] - 04/18/2018 11:51 EDT   Toileting Score   Patient's Independence Level with Toileting Tasks :   Assistance   Type of Assistance Necessary :   Assistance with toileting tasks   Amount of Assistance Needed :   Adjusting clothing before toileting, Adjusting clothing after toileting, Cleansing perineal area    Toileting :   Total assistance - 1   [name], OT, ELIZABETH G - 04/18/2018 13:59 EDT   Transfer Shower Score   Patient's independence Level with Transfer Shower Tasks :   Assistance   Type of Assistance Necessary :   Helper pushed shower chair into shower   Shower Transfer :   Total assistance - 1   [name] - 04/18/2018 11:51 EDT   Comprehension Score   Comprehends Complex or Abstract Information Without Prompting or Cueing :   Yes   Mode of Comprehension :   Auditory   Understands Complex or Abstract Directions and Conversations :   At all times   Functional Independence Measure Comprehension :   Complete independence - 7   [name] - 04/18/2018 11:51 EDT   Expression Score   Expresses Complex or Abstract Information Without Prompting or Cueing :   Yes   Expression Mode :   Vocal   Expresses Complex or Abstract Ideas :   Clearly and fluently at all times   Functional Independence Measure Expression :   Complete independence - 7   [name] - 04/18/2018 11:51  EDT   Social Interaction Score   Interacts Appropriately Without Supervision :   Yes   Interacts Appropriately :   At all times   Functional Independence Measure Social Interaction :   Complete independence - 7   [name] - 04/18/2018 11:51 EDT   Problem Solving Score   Solves Complex Problems :   Yes   Ability to Solve Complex Problems :   Consistently solves problems independently   Functional Independence Measure Problem Solving :   Complete independence - 7   [name] - 04/18/2018 11:51 EDT   Memory Score   Memory Score Comments :   FIM reviewed by [name], [name]   [name], OT, [name] - 04/18/2018 13:59 EDT   Recognizes, Remembers Routines, and Executes Requests Without Prompting :   Yes   Remembers and Executes Requests :   Consistently without need for repetition   Functional Independence Measure Memory :   Complete independence - 7   [name] - 04/18/2018 11:51 EDT

## 2018-04-18 NOTE — Progress Notes (Signed)
Functional Indep Measure Scores - Text       Functional Independence Measure Scores Entered On:  04/18/2018 17:59 EDT    Performed On:  04/18/2018 13:15 EDT by Josefa Half, PT, [place]               Transfer Bed/Chair/WC Score   Patient's independence Level with Bed, Chair, Wheelchair Tasks :   Assistance   Type of Assistance Necessary :   Total assist (patient performs less than 25%)   Bed, Chair, Wheelchair Transfer :   Total assistance - 1   [place], [place], [place] - 04/18/2018 17:58 EDT
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
## 2018-04-18 NOTE — Progress Notes (Signed)
Functional Indep Measure Scores - Text       Functional Independence Measure Scores Entered On:  04/18/2018 21:57 EDT    Performed On:  04/18/2018 21:55 EDT by Joycie Peek, RN, CECIL C               Eating Score   Patient's Independence Level With Eating Tasks :   Independent/Modified independence   Independent or Modifications Needed, Uses or Applies Independently :   Complete independence   Functional Independence Measure Eating :   Modified independence - 6   CINENSE, RN, CECIL C - 04/18/2018 21:55 EDT   Toileting Score   Patient's Independence Level with Toileting Tasks :   Assistance   Type of Assistance Necessary :   Assistance with toileting tasks   Amount of Assistance Needed :   Cleansing perineal area   Toileting :   Moderate assistance - 3   CINENSE, RN, CECIL C - 04/18/2018 21:55 EDT   Bladder Management Score   Patient's Independence Level with Bladder Management Tasks :   Assistance   Device/Techniques Utilized :   Catheter   Type of Assistance Necessary - Catheter :   More assist than touching (patient performs 50% - 74% of tasks)   Functional Independence Measure Bladder Management :   Moderate assistance - 3   Bladder Management Score Comments :   intermittent catheterization   CINENSE, RN, CECIL C - 04/18/2018 21:55 EDT   Bowel Management Score   Patient's Independence Level with Bowel Management Tasks :   Assistance   Devices/Techniques Utilized :   Enema/Suppository   Type of Assistance Necessary - Enema/Suppository :   Total assist (patient performs less than 25%)   Functional Independence Measure Bowel Management :   Total assistance - 1   Bowel Management Score Comments :   bowel program   CINENSE, RN, CECIL C - 04/18/2018 21:55 EDT   Transfer Bed/Chair/WC Score   Patient's independence Level with Bed, Chair, Wheelchair Tasks :   Assistance   Type of Assistance Necessary :   Two helpers needed, Mechanical lift required   Bed, Chair, Wheelchair Transfer :   Total assistance - 1   CINENSE, RN, CECIL C -  04/18/2018 21:55 EDT

## 2018-04-19 NOTE — Nursing Note (Signed)
Medication Administration Follow Up-Text       Medication Administration Follow Up Entered On:  04/19/2018 21:00 EDT    Performed On:  04/19/2018 19:15 EDT by Joycie Peek, RN, CECIL C      Intervention Information:     tramadol  Performed by Candie Echevaria, RN, JESSICA M on 04/19/2018 18:08:00 EDT       tramadol,50mg   Oral,mild pain (1-3)       Med Response   ED Medication Response :   No adverse reaction, Symptoms improved   Numeric Rating Pain Scale :   0 = No pain   Pasero Opioid Induced Sedation Scale :   1 = Awake and alert   Respiratory Rate :   18 br/min   CINENSE, RN, CECIL C - 04/19/2018 20:59 EDT

## 2018-04-19 NOTE — Nursing Note (Signed)
Medication Administration Follow Up-Text       Medication Administration Follow Up Entered On:  04/19/2018 3:54 EDT    Performed On:  04/19/2018 3:15 EDT by Joycie Peek, RN, CECIL C      Intervention Information:     oxycodone  Performed by Cindra Presume, RN, Santina Evans on 04/19/2018 02:11:00 EDT       oxycodone,5mg   Oral,moderate pain (4-7)       Med Response   ED Medication Response :   No adverse reaction, Symptoms improved   FACES Pain Scale :   0 = No hurt   Pasero Opioid Induced Sedation Scale :   S = Sleep, easy to arouse   Respiratory Rate :   18 br/min   CINENSE, RN, CECIL C - 04/19/2018 3:54 EDT

## 2018-04-19 NOTE — Progress Notes (Signed)
Functional Indep Measure Scores - Text       Functional Independence Measure Scores Entered On:  04/19/2018 21:25 EDT    Performed On:  04/19/2018 21:23 EDT by Joycie Peek, RN, CECIL C               Eating Score   Patient's Independence Level With Eating Tasks :   Independent/Modified independence   Independent or Modifications Needed, Uses or Applies Independently :   Complete independence   Functional Independence Measure Eating :   Modified independence - 6   CINENSE, RN, CECIL C - 04/19/2018 21:23 EDT   Toileting Score   Patient's Independence Level with Toileting Tasks :   Assistance   Type of Assistance Necessary :   Assistance with toileting tasks   Amount of Assistance Needed :   Cleansing perineal area   Toileting :   Moderate assistance - 3   CINENSE, RN, CECIL C - 04/19/2018 21:23 EDT   Bladder Management Score   Patient's Independence Level with Bladder Management Tasks :   Assistance   Device/Techniques Utilized :   Catheter   Type of Assistance Necessary - Catheter :   Assistance for more than half (patient performs 25% - 49% of tasks)   Functional Independence Measure Bladder Management :   Maximal assistance - 2   Bladder Management Score Comments :   needs help holding urinal while self cathing.   CINENSE, RN, CECIL C - 04/19/2018 21:23 EDT   Bowel Management Score   Patient's Independence Level with Bowel Management Tasks :   Assistance   Devices/Techniques Utilized :   Enema/Suppository   Type of Assistance Necessary - Enema/Suppository :   Helper changes linen or clothing/cleans up spill   Functional Independence Measure Bowel Management :   Total assistance - 1   CINENSE, RN, CECIL C - 04/19/2018 21:23 EDT   Transfer Bed/Chair/WC Score   Patient's independence Level with Bed, Chair, Wheelchair Tasks :   Assistance   Type of Assistance Necessary :   Two helpers needed, Mechanical lift required   Bed, Chair, Wheelchair Transfer :   Total assistance - 1   CINENSE, RN, CECIL C - 04/19/2018 21:23 EDT

## 2018-04-19 NOTE — Progress Notes (Signed)
Functional Indep Measure Scores - Text       Functional Independence Measure Scores Entered On:  04/19/2018 9:35 EDT    Performed On:  04/19/2018 9:34 EDT by Ottie Glazier, RN, Amy J               Eating Score   Patient's Independence Level With Eating Tasks :   Setup/Supervision   Supervision or Setup Needed :   Prepare food for eating   Functional Independence Measure Eating :   Supervision or setup - 5   Varnum, RN, Amy J - 04/19/2018 9:34 EDT   Bladder Management Score   Patient's Independence Level with Bladder Management Tasks :   Assistance   Device/Techniques Utilized :   Catheter   Type of Assistance Necessary - Catheter :   No more help than touching (patient performs 75% or more of tasks)   Functional Independence Measure Bladder Management :   Minimal contact assistance - 4   Varnum, RN, Amy J - 04/19/2018 9:34 EDT   Bowel Management Score   Patient's Independence Level with Bowel Management Tasks :   Assistance   Devices/Techniques Utilized :   Digital stimulation   Type of Assistance Necessary -   Digital Stimulation :   Helper performs digital stimulation only   Functional Independence Measure Bowel Management :   Total assistance - 1   Varnum, RN, Amy J - 04/19/2018 9:34 EDT   Transfer Bed/Chair/WC Score   Patient's independence Level with Bed, Chair, Wheelchair Tasks :   Assistance   Type of Assistance Necessary :   Mechanical lift required   Bed, Chair, Wheelchair Transfer :   Total assistance - 1   Varnum, RN, Salomon Fick - 04/19/2018 9:34 EDT
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
## 2018-04-19 NOTE — Progress Notes (Signed)
Functional Indep Measure Scores - Text       Functional Independence Measure Scores Entered On:  04/19/2018 15:09 EDT    Performed On:  04/19/2018 15:08 EDT by Jobe Igo K               Comprehension Score   Comprehends Complex or Abstract Information Without Prompting or Cueing :   Yes   Mode of Comprehension :   Auditory   Understands Complex or Abstract Directions and Conversations :   At all times   Functional Independence Measure Comprehension :   Complete independence - 7   Donzetta Sprung - 04/19/2018 15:08 EDT   Expression Score   Expresses Complex or Abstract Information Without Prompting or Cueing :   Yes   Expression Mode :   Vocal   Expresses Complex or Abstract Ideas :   Clearly and fluently at all times   Functional Independence Measure Expression :   Complete independence - 7   Donzetta Sprung - 04/19/2018 15:08 EDT   Social Interaction Score   Interacts Appropriately Without Supervision :   Yes   Interacts Appropriately :   At all times   Functional Independence Measure Social Interaction :   Complete independence - 7   Donzetta Sprung - 04/19/2018 15:08 EDT   Problem Solving Score   Solves Complex Problems :   Yes   Ability to Solve Complex Problems :   Consistently solves problems independently   Functional Independence Measure Problem Solving :   Complete independence - 7   Donzetta Sprung - 04/19/2018 15:08 EDT   Memory Score   Recognizes, Remembers Routines, and Executes Requests Without Prompting :   Yes   Remembers and Executes Requests :   Consistently without need for repetition   Functional Independence Measure Memory :   Complete independence - 7   Donzetta Sprung - 04/19/2018 15:08 EDT

## 2018-04-20 NOTE — Progress Notes (Signed)
Functional Indep Measure Scores - Text       Functional Independence Measure Scores Entered On:  04/20/2018 9:02 EDT    Performed On:  04/20/2018 9:00 EDT by Ottie Glazier, RN, Amy J               Eating Score   Patient's Independence Level With Eating Tasks :   Setup/Supervision   Supervision or Setup Needed :   Prepare food for eating   Functional Independence Measure Eating :   Supervision or setup - 5   Varnum, RN, Amy J - 04/20/2018 9:00 EDT   Bladder Management Score   Patient's Independence Level with Bladder Management Tasks :   Setup/Supervision   Supervision or Setup Needed :   Empty device, Gather equipment   Functional Independence Measure Bladder Management :   Supervision or setup - 5   Varnum, RN, Amy J - 04/20/2018 9:00 EDT   Bowel Management Score   Patient's Independence Level with Bowel Management Tasks :   Assistance   Devices/Techniques Utilized :   Enema/Suppository   Type of Assistance Necessary - Enema/Suppository :   Total assist (patient performs less than 25%)   Functional Independence Measure Bowel Management :   Total assistance - 1   Varnum, RN, Amy J - 04/20/2018 9:00 EDT   Transfer Bed/Chair/WC Score   Patient's independence Level with Bed, Chair, Wheelchair Tasks :   Assistance   Type of Assistance Necessary :   Mechanical lift required   Bed, Chair, Wheelchair Transfer :   Total assistance - 1   Varnum, RN, Amy J - 04/20/2018 9:00 EDT

## 2018-04-20 NOTE — Nursing Note (Signed)
Medication Administration Follow Up-Text       Medication Administration Follow Up Entered On:  04/20/2018 9:02 EDT    Performed On:  04/20/2018 9:00 EDT by Ottie Glazier, RN, Amy J      Intervention Information:     oxycodone  Performed by Ottie Glazier, RN, Amy J on 04/20/2018 08:09:00 EDT       oxycodone,5mg   Oral,moderate pain (4-7)       Med Response   ED Medication Response :   No adverse reaction   Numeric Rating Pain Scale :   2   Varnum, RN, Amy J - 04/20/2018 9:00 EDT

## 2018-04-21 NOTE — Progress Notes (Signed)
Functional Indep Measure Scores - Text       Functional Independence Measure Scores Entered On:  04/21/2018 8:31 EDT    Performed On:  04/21/2018 8:30 EDT by Ottie Glazier, RN, Amy J               Eating Score   Patient's Independence Level With Eating Tasks :   Independent/Modified independence   Independent or Modifications Needed, Uses or Applies Independently :   Complete independence   Functional Independence Measure Eating :   Complete independence - 7   Varnum, RN, Amy J - 04/21/2018 8:30 EDT   Bladder Management Score   Patient's Independence Level with Bladder Management Tasks :   Setup/Supervision   Supervision or Setup Needed :   Empty device, Gather equipment   Functional Independence Measure Bladder Management :   Supervision or setup - 5   Varnum, RN, Amy J - 04/21/2018 8:30 EDT   Bowel Management Score   Patient's Independence Level with Bowel Management Tasks :   Assistance   Devices/Techniques Utilized :   Enema/Suppository   Type of Assistance Necessary - Enema/Suppository :   Total assist (patient performs less than 25%)   Functional Independence Measure Bowel Management :   Total assistance - 1   Varnum, RN, Amy J - 04/21/2018 8:30 EDT   Transfer Bed/Chair/WC Score   Patient's independence Level with Bed, Chair, Wheelchair Tasks :   Assistance   Type of Assistance Necessary :   Mechanical lift required   Bed, Chair, Wheelchair Transfer :   Total assistance - 1   Varnum, RN, Amy J - 04/21/2018 8:30 EDT

## 2018-04-21 NOTE — Progress Notes (Signed)
Therapeutic Recreation Progress - Text       Therapeutic Recreation Progress Note Entered On:  04/21/2018 15:27 EDT    Performed On:  04/21/2018 15:23 EDT by Tory Emerald               Progress Note   Leisure Skill Activity #4     Leisure Skill Activity #1  Leisure Skill Activity #2        Therapeutic Focus :    Activity modification, Adaptive equipment training   Activity endurance, Sitting balance, Sitting tolerance           Level of Assistance :    Modified independence   Modified independence           Sessions Needed :    2 TX session   3 TX session             Tory Emerald - 04/21/2018 15:23 EDT  Tory Emerald - 04/21/2018 15:23 EDT        Community Reintegration Grid     Community Reintegration Activity #1  Community Reintegration Activity #2        Therapeutic Focus :    Resources   Barriers, Problem solving, Wheelchair mobility           Level of Assistance :    Complete independence   Modified independence           Sessions Needed :    2 [place] session   2 [place] session             Tory Emerald - 04/21/2018 15:23 EDT  Tory Emerald - 04/21/2018 15:23 EDT        Social Interaction Grid     Social Interaction Activity #1          Therapeutic Focus :    Social support, Other: adjustment              Level of Assistance :    Complete independence              Sessions Needed :    2 TX session              Goal Status :    Dixon Boos - 04/21/2018 15:23 EDT         Leisure Participation Grid     Initiation Leisure Activity #1          Therapeutic Focus :    Activity pattern development at home              Level of Assistance :    Modified independence              Sessions Needed :    1 TX session                Tory Emerald - 04/21/2018 15:23 EDT         Summary   Actual Deficits :   Adjustment, Leisure awareness, LEU use, Sitting balance   Perceived Deficits :   Coping   Planned Treatments :   Adaptive skills, Adjustment, Barriers, Community reentry,  Other: resources   Planned Frequency :   2-3 times per week   Planned Duration :   3-4 weeks   Overall Progress :   pt seen bedside 2*  medical status limited today by nausea and vomiting. Met SO and discussed bowel program. Pt reporting understanding of needs, decreased carryover of process, unable to sit unsupported as yet requiring A. Awaiting medical tests results.      Clydia Llano S - 04/21/2018 15:23 EDT   TR Charges   TR Time In :   14:30 EST   TR Time Out :   14:45 EST   TR Individualized Units :   1 units   TR Chart Total Treatment Time Units :   1 units   TR Daily Total Treatment Time Units :   1 units   Tory Emerald - 04/21/2018 15:23 EDT

## 2018-04-21 NOTE — Progress Notes (Signed)
Functional Indep Measure Scores - Text       Functional Independence Measure Scores Entered On:  04/21/2018 11:22 EDT    Performed On:  04/21/2018 11:22 EDT by [name]               Toileting Score   Patient's Independence Level with Toileting Tasks :   Assistance   Type of Assistance Necessary :   Assistance with toileting tasks   Amount of Assistance Needed :   Adjusting clothing before toileting, Adjusting clothing after toileting, Cleansing perineal area   Toileting :   Total assistance - 1   [name] - 04/21/2018 11:22 EDT   Comprehension Score   Comprehends Complex or Abstract Information Without Prompting or Cueing :   Yes   Mode of Comprehension :   Auditory   Understands Complex or Abstract Directions and Conversations :   Mild difficulty   Functional Independence Measure Comprehension :   Modified independence - 6   [name] - 04/21/2018 11:22 EDT   Expression Score   Expresses Complex or Abstract Information Without Prompting or Cueing :   Yes   Expression Mode :   Vocal   Expresses Complex or Abstract Ideas :   Mild difficulty   Functional Independence Measure Expression :   Modified independence - 6   [name] - 04/21/2018 11:22 EDT   Social Interaction Score   Interacts Appropriately Without Supervision :   Yes   Interacts Appropriately :   At all times   Functional Independence Measure Social Interaction :   Complete independence - 7   [name] - 04/21/2018 11:22 EDT   Problem Solving Score   Solves Complex Problems :   Yes   Ability to Solve Complex Problems :   Consistently solves problems independently   Functional Independence Measure Problem Solving :   Complete independence - 7   [name] - 04/21/2018 11:22 EDT   Memory Score   Memory Score Comments :   FIMs reviewed by [name], [name]   [name], OT, [name] - 04/21/2018 14:11 EDT   Recognizes, Remembers Routines, and Executes Requests Without Prompting :   Yes   Remembers and Executes Requests :    Consistently without need for repetition   Functional Independence Measure Memory :   Complete independence - 7   [name] - 04/21/2018 11:22 EDT

## 2018-04-21 NOTE — Progress Notes (Signed)
Functional Indep Measure Scores - Text       Functional Independence Measure Scores Entered On:  04/21/2018 3:05 EDT    Performed On:  04/21/2018 3:04 EDT by Azucena Kuba, RN, VANESSA               Bladder Management Score   Patient's Independence Level with Bladder Management Tasks :   Setup/Supervision   Supervision or Setup Needed :   Empty device, Gather equipment   Functional Independence Measure Bladder Management :   Supervision or setup - 5   Azucena Kuba, RN, VANESSA - 04/21/2018 3:04 EDT   Bowel Management Score   Patient's Independence Level with Bowel Management Tasks :   Assistance   Devices/Techniques Utilized :   Enema/Suppository   Type of Assistance Necessary - Enema/Suppository :   Total assist (patient performs less than 25%)   Functional Independence Measure Bowel Management :   Total assistance - 1   REID, RN, VANESSA - 04/21/2018 3:04 EDT

## 2018-04-21 NOTE — Nursing Note (Signed)
Medication Administration Follow Up-Text       Medication Administration Follow Up Entered On:  04/21/2018 16:57 EDT    Performed On:  04/21/2018 16:57 EDT by Ottie Glazier, RN, Amy J      Intervention Information:     ketorolac  Performed by Ottie Glazier, RN, Amy J on 04/21/2018 16:31:00 EDT       ketorolac,10mg   Oral       Med Response   ED Medication Response :   No adverse reaction   Ottie Glazier, RN, Amy J - 04/21/2018 16:57 EDT

## 2018-04-21 NOTE — Nursing Note (Signed)
Medication Administration Follow Up-Text       Medication Administration Follow Up Entered On:  04/21/2018 10:55 EDT    Performed On:  04/21/2018 10:55 EDT by Ottie Glazier, RN, Amy J      Intervention Information:     ketorolac  Performed by Ottie Glazier, RN, Amy J on 04/21/2018 10:28:00 EDT       ketorolac,10mg   Oral       Med Response   ED Medication Response :   No adverse reaction   Numeric Rating Pain Scale :   2   Varnum, RN, Amy J - 04/21/2018 10:55 EDT

## 2018-04-21 NOTE — Nursing Note (Signed)
Medication Administration Follow Up-Text       Medication Administration Follow Up Entered On:  04/21/2018 8:30 EDT    Performed On:  04/21/2018 8:29 EDT by Ottie Glazier, RN, Amy J      Intervention Information:     oxycodone  Performed by Ottie Glazier, RN, Amy J on 04/21/2018 07:31:00 EDT       oxycodone,5mg   Oral,moderate pain (4-7)       Med Response   ED Medication Response :   No adverse reaction   Numeric Rating Pain Scale :   2   Varnum, RN, Amy J - 04/21/2018 8:29 EDT

## 2018-04-21 NOTE — Nursing Note (Signed)
Medication Administration Follow Up-Text       Medication Administration Follow Up Entered On:  04/21/2018 13:22 EDT    Performed On:  04/21/2018 13:22 EDT by Ottie Glazier, RN, Amy J      Intervention Information:     ondansetron  Performed by Ottie Glazier, RN, Amy J on 04/21/2018 12:46:00 EDT       ondansetron,4mg   IV Push,Arm, Upper Left,nausea/vomiting       Med Response   ED Medication Response :   No adverse reaction   Ottie Glazier, RN, Amy J - 04/21/2018 13:22 EDT

## 2018-04-21 NOTE — Progress Notes (Signed)
Functional Indep Measure Scores - Text       Functional Independence Measure Scores Entered On:  04/22/2018 0:08 EDT    Performed On:  04/21/2018 23:50 EDT by Jena Gauss, LPN, APRIL T               Eating Score   Patient's Independence Level With Eating Tasks :   Independent/Modified independence   Independent or Modifications Needed, Uses or Applies Independently :   Complete independence   Functional Independence Measure Eating :   Complete independence - 7   MAXWELL, LPN, APRIL T - 9/60/4540 23:50 EDT   Toileting Score   Patient's Independence Level with Toileting Tasks :   Assistance   Type of Assistance Necessary :   Requires assistance of 2 people   Toileting :   Total assistance - 1   MAXWELL, LPN, APRIL T - 9/81/1914 23:50 EDT   Bladder Management Score   Patient's Independence Level with Bladder Management Tasks :   Independent/Modified independence   Independent or Modifications Needed, Uses or Applies Independently :   Intermittent catheterization, manages independently   Functional Independence Measure Bladder Management :   Modified independence - 6   MAXWELL, LPN, APRIL T - 7/82/9562 23:50 EDT   Bowel Management Score   Patient's Independence Level with Bowel Management Tasks :   Assistance   Devices/Techniques Utilized :   Enema/Suppository   Type of Assistance Necessary - Enema/Suppository :   Two helpers needed   Functional Independence Measure Bowel Management :   Total assistance - 1   MAXWELL, LPN, APRIL T - 01/09/8656 23:50 EDT   Transfer Bed/Chair/WC Score   Patient's independence Level with Bed, Chair, Wheelchair Tasks :   Assistance   Type of Assistance Necessary :   Two helpers needed, Mechanical lift required   Bed, Chair, Wheelchair Transfer :   Total assistance - 1   MAXWELL, LPN, APRIL T - 8/46/9629 23:50 EDT
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
## 2018-04-22 NOTE — Nursing Note (Signed)
Medication Administration Follow Up-Text       Medication Administration Follow Up Entered On:  04/22/2018 21:12 EDT    Performed On:  04/22/2018 21:12 EDT by Synthia Innocent      Intervention Information:     oxycodone  Performed by Synthia Innocent on 04/22/2018 20:01:00 EDT       oxycodone,5mg   Oral,moderate pain (4-7)       Med Response   ED Medication Response :   No adverse reaction, Symptoms improved   Numeric Rating Pain Scale :   3   Pasero Opioid Induced Sedation Scale :   1 = Awake and alert   Respiratory Rate :   16 br/min   Synthia Innocent - 04/22/2018 21:12 EDT

## 2018-04-22 NOTE — Nursing Note (Signed)
Medication Administration Follow Up-Text       Medication Administration Follow Up Entered On:  04/22/2018 17:23 EDT    Performed On:  04/22/2018 17:08 EDT by Raul Del, RN, Crystal N      Intervention Information:     ketorolac  Performed by Raul Del, RN, Crystal N on 04/22/2018 16:08:00 EDT       ketorolac,10mg   Oral       Med Response   ED Medication Response :   Symptoms improved   Numeric Rating Pain Scale :   0 = No pain   Raul Del RN, Crystal N - 04/22/2018 17:23 EDT

## 2018-04-22 NOTE — Progress Notes (Signed)
Functional Indep Measure Scores - Text       Functional Independence Measure Scores Entered On:  04/22/2018 15:33 EDT    Performed On:  04/22/2018 15:32 EDT by [name]               Lower Body Dressing Score   Patient's independence Level with Lower Body Dressing Tasks :   Setup/Supervision   Supervision or Setup Needed :   Gather clothes   LE Dressing :   Supervision or setup - 5   [name] - 04/22/2018 15:32 EDT (Not Validated)    Comprehension Score   Comprehends Complex or Abstract Information Without Prompting or Cueing :   Yes   Mode of Comprehension :   Auditory   Understands Complex or Abstract Directions and Conversations :   At all times   Functional Independence Measure Comprehension :   Complete independence - 7   [name] - 04/22/2018 15:32 EDT (Not Validated)    Expression Score   Expresses Complex or Abstract Information Without Prompting or Cueing :   Yes   Expression Mode :   Vocal   Expresses Complex or Abstract Ideas :   Clearly and fluently at all times   Functional Independence Measure Expression :   Complete independence - 7   [name] - 04/22/2018 15:32 EDT (Not Validated)    Social Interaction Score   Interacts Appropriately Without Supervision :   Yes   Interacts Appropriately :   At all times   Functional Independence Measure Social Interaction :   Complete independence - 7   [name] - 04/22/2018 15:32 EDT (Not Validated)    Problem Solving Score   Solves Complex Problems :   Yes   Ability to Solve Complex Problems :   Consistently solves problems independently   Functional Independence Measure Problem Solving :   Complete independence - 7   [name] - 04/22/2018 15:32 EDT (Not Validated)    Memory Score   Recognizes, Remembers Routines, and Executes Requests Without Prompting :   Yes   Remembers and Executes Requests :   Consistently without need for repetition   Functional Independence Measure Memory :   Complete independence - 7   [name] - 04/22/2018  15:32 EDT (Not Validated)

## 2018-04-22 NOTE — Nursing Note (Signed)
Medication Administration Follow Up-Text       Medication Administration Follow Up Entered On:  04/22/2018 12:53 EDT    Performed On:  04/22/2018 12:53 EDT by Raul Del, RN, Crystal N      Intervention Information:     ketorolac  Performed by Raul Del, RN, Crystal N on 04/22/2018 11:53:00 EDT       ketorolac,10mg   Oral       Med Response   ED Medication Response :   Symptoms improved   Numeric Rating Pain Scale :   0 = No pain   Raul Del, RN, Crystal N - 04/22/2018 12:53 EDT
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
## 2018-04-22 NOTE — Progress Notes (Signed)
Functional Indep Measure Scores - Text       Functional Independence Measure Scores Entered On:  04/22/2018 14:08 EDT    Performed On:  04/22/2018 14:06 EDT by Raul Del, RN, Crystal N               Eating Score   Patient's Independence Level With Eating Tasks :   Independent/Modified independence   Independent or Modifications Needed, Uses or Applies Independently :   Complete independence   Functional Independence Measure Eating :   Complete independence - 7   Raul Del, RN, Crystal N - 04/22/2018 14:06 EDT   Toileting Score   Patient's Independence Level with Toileting Tasks :   Assistance   Type of Assistance Necessary :   Requires assistance of 2 people   Toileting :   Total assistance - 1   Raul Del, RN, [place] - 04/22/2018 14:06 EDT   Bladder Management Score   Patient's Independence Level with Bladder Management Tasks :   Setup/Supervision   Supervision or Setup Needed :   Empty device, Gather equipment   Functional Independence Measure Bladder Management :   Supervision or setup - 5   Raul Del, RN, Crystal N - 04/22/2018 14:06 EDT   Bowel Management Score   Patient's Independence Level with Bowel Management Tasks :   Assistance   Devices/Techniques Utilized :   Digital stimulation   Type of Assistance Necessary -   Digital Stimulation :   Two helpers needed   Functional Independence Measure Bowel Management :   Total assistance - 1   Raul Del, RN, Crystal N - 04/22/2018 14:06 EDT   Transfer Bed/Chair/WC Score   Patient's independence Level with Bed, Chair, Wheelchair Tasks :   Assistance   Type of Assistance Necessary :   No more help than touching (patient performs 75% or more of tasks), Incidental help for contact guard or steadying   Bed, Chair, Wheelchair Transfer :   Minimal contact assistance - 4   Raul Del, RN, [employer] - 04/22/2018 14:06 EDT   Transfer Toilet Score   Patient's Independence Level with Transfer Toilet Tasks :   Assistance   Amount of Assistance Necessary :   No more help than touching (patient  performs 75% or more of tasks), Incidental help for contact guard or steadying   Transfer Toilet :   Minimal contact assistance - 4   Raul Del, RN, Crystal N - 04/22/2018 14:06 EDT
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
## 2018-04-22 NOTE — Nursing Note (Signed)
Medication Administration Follow Up-Text       Medication Administration Follow Up Entered On:  04/22/2018 6:34 EDT    Performed On:  04/22/2018 6:34 EDT by Jena Gauss, LPN, APRIL T      Intervention Information:     ketorolac  Performed by Jena Gauss, LPN, APRIL T on 04/22/2018 05:24:00 EDT       ketorolac,10mg   Oral       Med Response   ED Medication Response :   No adverse reaction, Symptoms improved   MAXWELL, LPN, APRIL T - 1/61/0960 6:34 EDT

## 2018-04-22 NOTE — Progress Notes (Signed)
Interdisciplinary Team Conference - Text       Interdisciplinary Team Conference PF Entered On:  04/22/2018 12:10 EDT    Performed On:  04/22/2018 12:08 EDT by Marzella Schlein, OT, [name]               Team Members   Care Manager :   Melonie [place]   Nurse :   Jimmey Ralph, RN, Megan A   Occupational Therapist :   Roby Lofts   Physical Therapist :   Carney Living, AMBER R   Rehab Physician :   Gregary Signs, PT, AMBER R - 04/22/2018 12:31 EDT   Primary Care Manager :   Melonie [place]   Primary Nurse :   Esmeralda Arthur, RN, Lenna Gilford   Primary OT :   Marzella Schlein, OT, [name]   Primary PT :   Eliseo Gum PT, AMBER R   Primary SLP :   Hilda Blades SLP, Gaylyn Rong, OT, [name] 04/22/2018 12:08 EDT   Team Conference Date :   04/22/2018 EDT   Eliseo Gum, PT, AMBER R - 04/22/2018 12:31 EDT     Nursing Summary.   Bowel Level of Assistance Interim :   Total assistance   Bowel Management :   Incontinent   Bowel Program :   Digital stimulation, Suppository   Bowel Movement Last Date :   04/15/2018 EDT   Bladder Level of Assistance Interim :   Supervision or setup   Bladder Management :   Continent   Urinary Elimination Management :   Intermittent straight catheter   Tracheostomy Information :   No qualifying data available.       Number Times Suctioned This Shift :   0    Nursing Team Notes Current :   Yes   Jimmey Ralph, RN, Aundra Millet A - 04/22/2018 12:13 EDT   OT Basic ADL   Basic ADL Grid   Eating :   Supervision or setup   Grooming :   Modified independence   Bathing :   Minimal contact assistance   UE Dressing :   Supervision or setup   LE Dressing :   Moderate assistance   Toileting :   Total assistance   Transfer Toilet :   Total assistance   Tub Transfer :   Does not occur   Shower Transfer :   Total assistance   [name], OT, ELIZABETH G - 04/22/2018 12:08 EDT   ADL Comments :   5/10- TLSO placed on prior to shower. Pt mod A sliding board trf EOB> shower chair. Therapist wheeled showerchair into shower. Pt able to wash all body  parts c long handled sponge but req A for buttocks and B feet. Pt completed grooming shower chair level at sink i'ly. Pt max A sliding board trf shower chair > EOB. Pt set up for UB dressing d/t ability to roll side to side when pulling shirt over trunk. Pt able to thread briefs/pants B feet and legs but req A to pull over hips though pt able to aid by log rolling.      OT ADL Reviewed :   Yes   [name], OT, ELIZABETH G - 04/22/2018 12:08 EDT   OT Short Term Goals.   Upper Body Dressing Short Term Goal Grid     Goal #1          Activity :    Don/Doff pull over shirt  Assist :    Setup              Status :    Goal met              Date Met :    04/18/2018 EDT              Comment :    5/10- Pt able to don shirt in supine by rolling side to side.                [name], OT, ELIZABETH G - 04/22/2018 12:08 EDT         Lower Body Dressing Grid     Goal #1  Goal #2  Goal #3  Goal #4    Activity :    Don/Doff pants, Don/Doff underwear   Don/Doff socks   Don/Doff shoes   Don/Doff pants, Don/Doff underwear     Assist :    Moderate assistance   Moderate assistance   Moderate assistance   Minimal assistance     Status :    Goal met   Goal met   Initial   Progressing, continue     Date Met :    04/17/2018 EDT              Comment :       5/8 - pt able to don/doff socks in supported long sit by pulling foot up and ontop of other leg.             Marzella Schlein, OT, ELIZABETH G - 04/22/2018 12:08 EDT  Marzella Schlein, OT, ELIZABETH G - 04/22/2018 12:08 EDT  Marzella Schlein, OT, ELIZABETH G - 04/22/2018 12:08 EDT  [name], OT, ELIZABETH G - 04/22/2018 12:08 EDT      Bathing Goal Grid     Goal #1  Goal #2  Goal #3      Activity :    Sponge bath   Shower transfer   Bathe        Assist :    Minimal assistance   Moderate assistance   Setup        Status :    Goal met   Initial   Initial        Date Met :    04/18/2018 EDT              Comment :    Goal met at shower level with long handled sponge.                Marzella Schlein, OT, ELIZABETH G - 04/22/2018 12:08 EDT   Marzella Schlein, OT, ELIZABETH G - 04/22/2018 12:08 EDT  [name], OT, [name] - 04/22/2018 12:08 EDT       Toileting and Transfers Goal Grid     Goal #1          Activity :    Bladder care              Assist :    Minimal assistance              Equipment :    Other: Catheter              Status :    Goal met              Date Met :    04/17/2018 EDT              Comment :    based on reliable pt report,  performing with min A from nursing.                [name], OT, ELIZABETH G - 04/22/2018 12:08 EDT         OT Balance Goal Grid     Goal #1  Goal #2        Descriptors :       Unsupported short sit           Type :    Dynamic sitting balance   Dynamic standing balance           Assist :    Minimal assistance   Distant supervision           Length of Time (minutes) :    5 minutes   15 minutes           Rationale :    Improve independence with activities of daily living   Improve independence with activities of daily living           Status :    Goal met   Initial           Date Met :    04/17/2018 EDT                Marzella Schlein, OT, ELIZABETH G - 04/22/2018 12:08 EDT  Flora Lipps G - 04/22/2018 12:08 EDT        OT STG Reviewed :   Verne Spurr, OT, ELIZABETH G - 04/22/2018 12:08 EDT   OT Long Term Goals.   OT IP Long Term Goals Grid     Long Term Goal 1          Goal :    Pt will complete fxl mobility task propelling w/c > 10 mins around hospital to incre endurance/ community moiblity              Status :    Initial                [name], OT, ELIZABETH G - 04/22/2018 12:08 EDT         OT ADL FIM Long Term Goals   Eating Goal :   Complete independence - 7   Grooming Goal :   Complete independence - 7   Bathing Goal :   Supervision or setup - 5   Upper Extremity Dressing Goal :   Modified independence - 6   Lower Body Dressing Goal :   Supervision or setup - 5   Toileting Goal :   Supervision or setup - 5   Toilet Transfer Goal :   Minimal contact assistance - 4   Marzella Schlein, OT, ELIZABETH G - 04/22/2018 12:08 EDT   OT Long Term Goals  Reviewed :   Verne Spurr, OT, ELIZABETH G - 04/22/2018 12:08 EDT   Occupational Therapy Summary.   Barriers to Safe Discharge OT :   Complicated medical history, Insight into deficits, Safety awareness, Severity of deficits   Additional Comments DME OT :   Pt progressing slowly, however improving despite NWB.   OT Team Notes Current :   Yes   [name], OT, ELIZABETH G - 04/22/2018 12:08 EDT   PT Mobility   Mobility Grid   Roll Left :   Rehab Minimal assistance   Roll Right :   Rehab Minimal assistance   Roll Supine :   Rehab Minimal assistance   Supine to Sit :   Rehab Maximal assistance  Sit to Supine :   Rehab Total assistance   Scooting :   Rehab Total assistance   Sit to Stand :   Does not occur   Stand to Sit :   Does not occur   Transfer Bed to and From Chair :   Rehab Total assistance   Transfer Toilet :   Does not occur   Car Transfer :   Does not occur   Floor Recovery :   Does not occur   [place], PT, AMBER R - 04/22/2018 12:30 EDT   Functional Mobility Details :   ..   [place], PT, AMBER R - 04/22/2018 12:30 EDT   Amb Ability Varied Surf/Distraction Grid   Level Surfaces :   Does not occur   Uneven Surfaces :   Does not occur   Distracting Environments :   Does not occur   Curbs :   Does not occur   Stairs :   Does not occur   Ramp :   Does not occur   [place], PT, AMBER R - 04/22/2018 12:30 EDT   Transfer Type :   Transfer board   PT Mobility Reviewed :   Yes   [place], PT, AMBER R - 04/22/2018 12:30 EDT   PT WC Management   Type of Wheelchair :   Manual wheelchair   Wheelchair Details :   cues for proper propulsion technique in ultralight w/c for shoulder preservation, will need further practice. Edu on lateral pressure reliefs with good return demo. Pt uncomfortable w/ forward lean pressure reliefs 2* dec balance   [place], PT, AMBER R - 04/22/2018 12:30 EDT   Wheelchair Mobility Grid   Level Surfaces :   Close supervision   [place], PT, AMBER R - 04/22/2018 12:30 EDT   Wheelchair Mobility Level Distance :   300 ft    Wheelchair Mobility Reviewed :   Yes   [place], PT, AMBER R - 04/22/2018 12:30 EDT   PT Short Term Goals.   Bed Mobility Goal Grid     Goal #1  Goal #2  Goal #3      Descriptors :    Supine to sit   Sit to supine   Roll to right and left        Level :    Moderate assistance   Moderate assistance   Close supervision        Status :    Progressing, continue   Progressing, continue   Progressing, continue          [place], PT, AMBER R - 04/22/2018 12:30 EDT  Eliseo Gum, PT, AMBER R - 04/22/2018 12:30 EDT  [place], PT, AMBER R - 04/22/2018 12:30 EDT       Transfers Goal Grid     Goal #1          Descriptors :    Transfer board              Level :    Maximal assistance              Device :    Transfer board              Status :    Progressing, continue                [place], PT, AMBER R - 04/22/2018 12:30 EDT         W/C Management Grid     Goal #1  Goal #2  Descriptors :    Mobility with manual wheelchair   Pressure relief forward lean           Level :    Modified independence   Distant supervision           Distance :    300 ft              Status :    Progressing, continue   Progressing, continue             [place], PT, AMBER R - 04/22/2018 12:30 EDT  [place], PT, AMBER R - 04/22/2018 12:30 EDT        PT Balance Goal Grid     Goal #1  Goal #2        Descriptor :    Supported short sit   Unsupported short sit           Assist Level :    Modified independence   Close supervision           Type :    Static sitting   Static sitting           Length of Time (minutes) :    5 minutes   5 minutes           Status :    Progressing, continue   Progressing, continue             [place], PT, AMBER R - 04/22/2018 12:30 EDT  Eliseo Gum, PT, AMBER R - 04/22/2018 12:30 EDT        PT STG Reviewed :   Silvio Clayman, PT, AMBER R - 04/22/2018 12:30 EDT   PT Long Term Goals.   Outpatient PT Long Term Goals Rehab     Long Term Goal 1          Goal :    Pt will improve SCIM mobility to 15/40.              Status :    Progressing, continue                 [place], PT, AMBER R - 04/22/2018 12:30 EDT         DCP GENERIC CODE   Bed, Chair, Wheelchair Goal :   Modified independence - 6   Wheelchair Mobility Level Surfaces Goal :   Modified independence - 6   [place], PT, AMBER R - 04/22/2018 12:30 EDT   Type of Wheelchair Goal :   Manual wheelchair   Mode of Locomotion Goal :   Wheelchair   [place], PT, AMBER R - 04/22/2018 12:30 EDT   Physical Therapy Summary.   PT Equipment Anticipated or Recommended :   Ultralightweight Manual Wheelchair   PT Team Notes Current :   Yes   [place], PT, AMBER R - 04/22/2018 12:30 EDT   Severity Level.   Comprehension Indep Measure Interim :   Complete independence - 7   Comprehension Mode :   Auditory   Expression Indep Measure Interim :   Complete independence - 7   Expression Mode :   Vocal   Problem Solving Indep Measure Interim :   Modified independence - 6   Memory Indep Measure Interim :   Modified independence - 6   Social Interaction Indep Measure Interim :   Complete independence - 7   [name], OT, ELIZABETH G - 04/22/2018 12:08 EDT   SLP Short Term Goals.   SLP STG Reviewed :  Yes   [place], PT, AMBER R - 04/22/2018 12:31 EDT   Speech Therapy Summary.   Barriers to Safe Discharge SLP :   Severity of deficits   SLP Progress Note Current :   Yes   Additional Comments SLP Summary :   PRN SLP EVALUATED PT. AND HE SCORED 24/30 MOCA 7/1. SLP SIGNED OFF ON THIS CASE.   Eliseo Gum, PT, AMBER R - 04/22/2018 12:31 EDT   Psychology Summary.   Psychology Progress Notes Current :   Silvio Clayman, PT, AMBER R - 04/22/2018 12:31 EDT   Interdisciplinary Discharge Planning.   Team Conference Discussion RTF :   Tonna Boehringer, PT served as scribe during team conference this date.     WB status changed to WBAT R UE     [place], PT, AMBER R - 04/22/2018 12:31 EDT   Rehab Anticipated Discharge Date :   05/01/2018 EDT   Next Level of Care :   Home Health   Barriers to goals/Reasons for skilled intervention :   Decreased sitting/standing balance, Decreased  strength/ROM, Pain management issues, Skin integrity issues   Medical/rehab reason for continued stay :   Bowel/bladder, Brady/tachycardia, Community mobility issue, Electrolyte imbalance, Medication adjustments, Orthostatic hypotension, Pain management, Paresis, Pending labs/diagnostics, Skin integrity, Unsafe discharge environment, Wound   [place], PT, AMBER R - 04/22/2018 12:30 EDT   Anticipated Therapy Interventions.   PT Estimated Hours per Week :   7.5 hours per week   Therapy Activity Tolerance :   15 hours over 7 days   OT Estimated Hours Per Week :   7.5 hours per week   Eliseo Gum, PT, AMBER R - 04/22/2018 12:30 EDT   Rehab Team Goals   Team Mobility Goals Grid     Goal #1          Team Mobility Goals :    Toilet transfers              Team Mobility Assist Level :    With max assist              Team Mobility Goal Status :    Ongoing                [place], PT, AMBER R - 04/22/2018 12:30 EDT         Team Mobility Goals 2 Grid     Goal #1          Team Mobility Goals :    Bed and or wheelchair transfer              Team Mobility Assist Level :    With max assist              Team Mobility Goal Status :    Ongoing                [place], PT, AMBER R - 04/22/2018 12:30 EDT         Team Pain Management Goals Grid     Goal #1          Team Pain Management Goals :    Acute pain control              Team Pain Management Goals Status :    Ongoing                Carney Living, AMBER R - 04/22/2018 12:30 EDT         Team Patient/Family Ed Goal Grid  Goal #1          Team Patient/Family Education Goals :    Participate in family teaching/education              Team Patient/Family Edu Goal Status :    Ongoing                [place], PT, AMBER R - 04/22/2018 12:30 EDT
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
## 2018-04-23 NOTE — Nursing Note (Signed)
Medication Administration Follow Up-Text       Medication Administration Follow Up Entered On:  04/23/2018 23:23 EDT    Performed On:  04/23/2018 23:15 EDT by Joycie Peek, RN, CECIL C      Intervention Information:     tramadol  Performed by [employer], RN, CECIL C on 04/23/2018 22:10:00 EDT       tramadol,50mg   Oral,mild pain (1-3)       Med Response   ED Medication Response :   No adverse reaction, Symptoms improved   FACES Pain Scale :   0 = No hurt   Pasero Opioid Induced Sedation Scale :   S = Sleep, easy to arouse   Respiratory Rate :   18 br/min   CINENSE, RN, CECIL C - 04/23/2018 23:23 EDT

## 2018-04-23 NOTE — Progress Notes (Signed)
Therapeutic Recreation Progress - Text       Therapeutic Recreation Progress Note Entered On:  04/23/2018 17:47 EDT    Performed On:  04/23/2018 17:47 EDT by Tory Emerald               Progress Note   Leisure Skill Activity #4     Leisure Skill Activity #1  Leisure Skill Activity #2        Therapeutic Focus :    Activity modification, Adaptive equipment training   Activity endurance, Sitting balance, Sitting tolerance           Level of Assistance :    Modified independence   Modified independence           Sessions Needed :    2 TX session   3 TX session             Tory Emerald - 04/23/2018 17:47 EDT  Tory Emerald - 04/23/2018 17:47 EDT        Community Reintegration Grid     Community Reintegration Activity #1  Community Reintegration Activity #2        Therapeutic Focus :    Resources   Barriers, Problem solving, Wheelchair mobility           Level of Assistance :    Complete independence   Modified independence           Sessions Needed :    2 TX session   2 Western session             Tory Emerald - 04/23/2018 17:47 EDT  Tory Emerald - 04/23/2018 17:47 EDT        Social Interaction Grid     Social Interaction Activity #1          Therapeutic Focus :    Social support, Other: adjustment              Level of Assistance :    Complete independence              Sessions Needed :    2 TX session              Goal Status :    Dixon Boos - 04/23/2018 17:47 EDT         Leisure Participation Grid     Initiation Leisure Activity #1          Therapeutic Focus :    Activity pattern development at home              Level of Assistance :    Modified independence              Sessions Needed :    1 TX session                Tory Emerald - 04/23/2018 17:47 EDT         Summary   Actual Deficits :   Adjustment, Leisure awareness, LEU use, Sitting balance   Perceived Deficits :   Coping   Planned Treatments :   Adaptive skills, Adjustment, Barriers, Community reentry,  Other: resources   Planned Frequency :   2-3 times per week   Planned Duration :   3-4 weeks   Overall Progress :   pt missed tx 2*  scheduling prior to ADLs, then second attempt- pt in middle of bowel program. Unable to reschedule.      Tory Emerald - 04/23/2018 17:47 EDT

## 2018-04-23 NOTE — Nursing Note (Signed)
Medication Administration Follow Up-Text       Medication Administration Follow Up Entered On:  04/23/2018 17:09 EDT    Performed On:  04/23/2018 17:09 EDT by Raul Del, RN, Crystal N      Intervention Information:     ketorolac  Performed by Raul Del, RN, Crystal N on 04/23/2018 16:08:00 EDT       ketorolac,10mg   Oral       Med Response   ED Medication Response :   Symptoms improved   Numeric Rating Pain Scale :   0 = No pain   Raul Del, RN, Crystal N - 04/23/2018 17:09 EDT

## 2018-04-23 NOTE — Progress Notes (Signed)
Functional Indep Measure Scores - Text       Functional Independence Measure Scores Entered On:  04/23/2018 22:40 EDT    Performed On:  04/23/2018 22:38 EDT by Joycie Peek, RN, CECIL C               Eating Score   Patient's Independence Level With Eating Tasks :   Independent/Modified independence   Independent or Modifications Needed, Uses or Applies Independently :   Complete independence   Functional Independence Measure Eating :   Modified independence - 6   CINENSE, RN, CECIL C - 04/23/2018 22:38 EDT   Toileting Score   Patient's Independence Level with Toileting Tasks :   Setup/Supervision   Supervision or Setup Needed :   Standby prompting   Toileting :   Supervision or setup - 5   CINENSE, RN, CECIL C - 04/23/2018 22:38 EDT   Bladder Management Score   Patient's Independence Level with Bladder Management Tasks :   Setup/Supervision   Supervision or Setup Needed :   Empty device, Gather equipment, Standby prompting   Functional Independence Measure Bladder Management :   Supervision or setup - 5   Bladder Management Score Comments :   assist with gathering equipments/materials, pt catheterizes self   CINENSE, RN, CECIL C - 04/23/2018 22:38 EDT   Bowel Management Score   Patient's Independence Level with Bowel Management Tasks :   Assistance   Devices/Techniques Utilized :   Enema/Suppository   Type of Assistance Necessary - Enema/Suppository :   Helper changes linen or clothing/cleans up spill   Functional Independence Measure Bowel Management :   Total assistance - 1   CINENSE, RN, CECIL C - 04/23/2018 22:38 EDT

## 2018-04-23 NOTE — Nursing Note (Signed)
Medication Administration Follow Up-Text       Medication Administration Follow Up Entered On:  04/23/2018 6:57 EDT    Performed On:  04/23/2018 6:57 EDT by Synthia Innocent      Intervention Information:     ketorolac  Performed by Synthia Innocent on 04/23/2018 06:45:00 EDT       ketorolac,10mg   Oral       Med Response   Numeric Rating Pain Scale :   0 = No pain   Pasero Opioid Induced Sedation Scale :   1 = Awake and alert   Synthia Innocent - 04/23/2018 6:57 EDT

## 2018-04-23 NOTE — Progress Notes (Signed)
Functional Indep Measure Scores - Text       Functional Independence Measure Scores Entered On:  04/23/2018 15:41 EDT    Performed On:  04/23/2018 13:00 EDT by Eliseo Gum, PT, AMBER R               Transfer Bed/Chair/WC Score   Patient's independence Level with Bed, Chair, Wheelchair Tasks :   Assistance   Type of Assistance Necessary :   Incidental help for contact guard or steadying   Bed, Chair, Wheelchair Transfer :   Minimal contact assistance - 4   BENTON, PT, AMBER R - 04/23/2018 15:40 EDT   Walk/Wheelchair Score   Assessed Locomotion in a Wheelchair :   Yes   Distance Traveled in a Wheelchair :   300 ft   Amount of Assistance Necessary :   No assistance necessary   Modified Independence Qualifiers :   Patient can independently operate a manual or motorized wheelchair for a minimum of 150 feet; turns around; maneuvers the chair to a table, bed, toilet; negotiates at least a 3 percent grade; and maneuvers on rugs and over door sills   Functional Independence Measure Wheelchair :   Modified independence - 6   BENTON, PT, AMBER R - 04/23/2018 15:40 EDT

## 2018-04-23 NOTE — Progress Notes (Signed)
Functional Indep Measure Scores - Text       Functional Independence Measure Scores Entered On:  04/23/2018 12:52 EDT    Performed On:  04/23/2018 12:50 EDT by [name]               Bathing Score   Patient's Independence Level with Bathing Tasks :   Assistance   Type of Assistance Necessary :   Assistance with bathing body parts   Body Parts Assessed :   All body surfaces   Tasks Requiring Assistance :   Buttocks   Functional Independence Measure Bathing :   Minimal contact assistance - 4   [name] - 04/23/2018 12:50 EDT   Upper Body Dressing Score   Patient's Independence Level with Upper Body Dressing Tasks :   Setup/Supervision   Supervision or Setup Needed :   Gather clothes   UE Dressing :   Supervision or setup - 5   [name] - 04/23/2018 12:50 EDT   Lower Body Dressing Score   Patient's independence Level with Lower Body Dressing Tasks :   Setup/Supervision   Supervision or Setup Needed :   Cueing, Gather clothes   LE Dressing :   Supervision or setup - 5   [name] - 04/23/2018 12:50 EDT   Toileting Score   Patient's Independence Level with Toileting Tasks :   Assistance   Type of Assistance Necessary :   Assistance with toileting tasks   Amount of Assistance Needed :   Cleansing perineal area   Toileting :   Moderate assistance - 3   [name] - 04/23/2018 12:50 EDT   Transfer Shower Score   Patient's independence Level with Transfer Shower Tasks :   Assistance   Type of Assistance Necessary :   Helper pushed shower chair into shower   Shower Transfer :   Total assistance - 1   [name] - 04/23/2018 12:50 EDT   Comprehension Score   Comprehends Complex or Abstract Information Without Prompting or Cueing :   Yes   Mode of Comprehension :   Auditory   Understands Complex or Abstract Directions and Conversations :   At all times   Functional Independence Measure Comprehension :   Complete independence - 7   [name] - 04/23/2018 12:50 EDT   Expression Score   Expresses  Complex or Abstract Information Without Prompting or Cueing :   Yes   Expression Mode :   Vocal   Expresses Complex or Abstract Ideas :   Clearly and fluently at all times   Functional Independence Measure Expression :   Complete independence - 7   [name] - 04/23/2018 12:50 EDT   Social Interaction Score   Interacts Appropriately Without Supervision :   Yes   Interacts Appropriately :   At all times   Functional Independence Measure Social Interaction :   Complete independence - 7   [name] - 04/23/2018 12:50 EDT   Problem Solving Score   Solves Complex Problems :   Yes   Ability to Solve Complex Problems :   Consistently solves problems independently   Functional Independence Measure Problem Solving :   Complete independence - 7   [name] - 04/23/2018 12:50 EDT   Memory Score   Memory Score Comments :   FIM reviewed by [name], [name]   [name], OT, [name] - 04/23/2018 15:01 EDT   Recognizes,  Remembers Routines, and Executes Requests Without Prompting :   Yes   Remembers and Executes Requests :   Consistently without need for repetition   Functional Independence Measure Memory :   Complete independence - 7   [name] - 04/23/2018 12:50 EDT

## 2018-04-23 NOTE — Nursing Note (Signed)
Medication Administration Follow Up-Text       Medication Administration Follow Up Entered On:  04/23/2018 13:20 EDT    Performed On:  04/23/2018 13:20 EDT by Raul Del, RN, Crystal N      Intervention Information:     ketorolac  Performed by Raul Del, RN, Crystal N on 04/23/2018 12:03:00 EDT       ketorolac,10mg   Oral       Med Response   ED Medication Response :   Symptoms improved   Numeric Rating Pain Scale :   0 = No pain   Raul Del, RN, Crystal N - 04/23/2018 13:20 EDT

## 2018-04-23 NOTE — Progress Notes (Signed)
 PT Inpatient Daily Documentation - Text       PT Inpatient Daily Documentation Entered On:  04/23/2018 15:11 EDT    Performed On:  04/23/2018 13:00 EDT by LORELLA, PT, AMBER R               Reason for Treatment   *Reason for Referral :   [employer] T7 AIS A on R/ T11 AIS A on the L with zpp through L3.   Pt suffered T11-12 burst fx, multiple rib fx's, T4-7 vertebral body fx's, R SI joint widening req ORIF, L sacral fx, L ulnar styloid fx, R ulnar fx      Precautions: TLSO OOB, NWB R LE WBAT LLE/UE, WBAT R UE ;   15HRS/week     BENTON, PT, AMBER R - 04/23/2018 15:40 EDT   Review/Treatments Provided   Pain Present :   Yes actual or suspected pain   PT Therapeutic Activity,Mobility,Balance :   Yes   PT Therapeutic Exercise :   Yes   PT Wheelchair Management :   Yes   BENTON, PT, AMBER R - 04/23/2018 15:11 EDT   PT Goals :   PT Short Term Goals    04/22/2018  Bed Mobility Goal #1: Supine to sit; Mod A; Progressing, continue  Bed Mobility Goal #2: Sit to supine; Mod A; Progressing, continue  Bed Mobility Goal #3: Roll to right and left; Close S; Progressing, continue  Transfer Goal #1: Transfer board; Max A; Transfer board; Progressing, continue  Wheelchair Management Goal #1: Mobility with manual wheelchair; Mod I; 300; Progressing, continue  Wheelchair Management Goal #2: Pressure relief forward lean; Distant S; Progressing, continue  Balance Goal #1: Supported short sit; Mod I; Static sitting; 5; Progressing, continue  Balance Goal #2: Unsupported short sit; Close S; Static sitting; 5; Progressing, continue     PT Plan :   Treatment Frequency:  Daily (modified)   Performed By: Mendoza, PT, [name]  04/12/2018 16:22  Treatment Duration: 3 Performed By: Mendoza, PT, [name]  04/12/2018 16:22  Planned Treatments: Balance training, Bed mobility training, Caregiver training, Equipment training, Functional training, Manual therapy, Moist heat/ice, Neuromuscular reeducation, Pain management, Patient education, Therapeutic  activities, Therapeutic exercises, Transfer... Performed By: Mendoza, PT, [name]  04/12/2018 16:22     Short Term Goals Reviewed :   Yes   PT Long Term Goals Reviewed :   Yes   Physical Therapy Orders :   Physical Therapy Medical Hold - 04/21/18 15:49:00 EDT, Stop date 04/21/18 15:49:00 EDT  Physical Therapy Medical Hold - 04/16/18 11:13:00 EDT, Stop date 04/16/18 11:13:00 EDT, incontinent bowel and bladder due to bowel program not completed.  Physical Therapy Inpatient Additional Treatment Rehab - 04/12/18 16:52:55 EDT, Balance training, Bed mobility training, Caregiver training, Equipment training, Functional training, Manual therapy, Moist heat/ice, Neuromuscular reeducation, Pain management, Patient education, Therapeutic activities, Therape...  PT FIMS - 04/11/18 12:54:00 EDT, Daily     BENTON, PT, AMBER R - 04/23/2018 15:40 EDT   Pain Assessment   Pain Location :   Back   Self Report Pain :   Numeric rating scale   BENTON, PT, AMBER R - 04/23/2018 15:11 EDT   Therapeutic Activities/Mobility/Balance   Functional Activity :   Therapeutic Activities  Activity 1:  Transfer training; Mod A, Total A; Sliding board; Pt performed first 3 WC <> mat table transfers today with total assist 2/2 RUE WB precautions. For this afternoon's session, RUE was WBAT, so pt was able to perform  with mod A and VC's for sequencing and hand placement.       Performed Date:  04/22/2018  Activity 2:  Transfer training; CGA, Maximal tactile cues; Supported short sit; Pt performed 10 reps x 2 sets press-ups/rear lifts at EOB c emphasis on forward lean and elbow extension.       Performed Date:  04/22/2018  Activity 3:  Bed mobility; Minimal assistance; Supine; Pt performed R/L rolling in bed w/ min A for LE management and VC's for using own momentum.       Performed Date:  04/22/2018  Activity 4:  Dynamic sitting balance, Static sitting balance; CGA, Minimal assistance, Maximal tactile cues; Supported short sit, Unsupported short sit; Pt  performed seated balance progression. Beginning with supported reaching outside BOS --> unsupported --> rhythmic stabilization --> slow reversals. Pt had moderately frequent LOB with rhythmic stab/slw reversls & req min A to regain balance.       Performed Date:  04/22/2018  Activity 5:  Balance, Dynamic sitting balance; CGA, Minimal assistance; Unsupported short sit; Pt lifted 3# bar in forward flx to 90 deg with BUE. required CGA to min A to achieve position, but was able to maintain position for 1 min x 2 sets w/ SBA.       Performed Date:  04/22/2018     LORELLA PT, AMBER R - 04/23/2018 15:11 EDT   PT Therapeutic Activities Grid     Activity 1  Activity 2  Activity 3  Activity 4    Activity :    Transfer training   Bed mobility           Assist :    Minimal assistance, Moderate assistance   Setup, Minimal assistance, Moderate assistance           Response :    Demonstrated positive response to treatment, Improved stability in muscle group(s), Improved timing, control, and coordination, Increased activation of targeted muscle group(s)   Demonstrated positive response to treatment, Improved stability in muscle group(s), Improved timing, control, and coordination, Increased activation of targeted muscle group(s)           Comment :    SB transfer with mod A initially to sustain ant WS, progressed to min A for manual cues at distal femur for pelvis position only x 4 reps   supine to supine on elbows initaly with mod A progressing to SPV following training, VC for technique of lateral WS and cervical flexion x 10 reps. Supine on elbows to long sit with SPV following training x 5 reps, VC for WS technique. LE management (cont   (cont) into short sitting with VC for hand placment for stabalizing hand and hand to move LE. short sit to long sit LE management using ipsilateral elbow propped technique initially with mod A for stabalization of lead LE and initiation of 2nd LE; (cont)   (cont) progressed to requiring SPV and  occ VC for placement of elbow and LE. x 5 reps       BENTON, PT, AMBER R - 04/23/2018 15:11 EDT  LORELLA, PT, AMBER R - 04/23/2018 15:11 EDT  LORELLA, PT, AMBER R - 04/23/2018 15:11 EDT  BENTON, PT, AMBER R - 04/23/2018 15:11 EDT      Reassess Mobility :   Yes   BENTON, PT, AMBER R - 04/23/2018 15:11 EDT   PT Mobility   Mobility Grid   Roll Left :   Setup   Roll Right :   Setup  Roll Prone :   Rehab Minimal assistance   Roll Supine :   Setup   Supine to Sit :   Setup   Sit to Supine :   Setup   Scooting :   Setup   Sit to Stand :   Does not occur   Stand to Sit :   Does not occur   Transfer Bed to and From Chair :   Rehab Minimal assistance   Transfer Toilet :   Total assistance - 1   Car Transfer :   Does not occur   Floor Recovery :   Does not occur   BENTON, PT, AMBER R - 04/23/2018 15:11 EDT   Functional Mobility Details :   ..   BENTON, PT, AMBER R - 04/23/2018 15:11 EDT   Amb Ability Varied Surf/Distraction Grid   Level Surfaces :   Does not occur   Uneven Surfaces :   Does not occur   Distracting Environments :   Does not occur   Curbs :   Does not occur   Stairs :   Does not occur   Ramp :   Does not occur   BENTON, PT, AMBER R - 04/23/2018 15:11 EDT   Transfer Type :   Transfer board   PT Mobility Reviewed :   Yes   BENTON, PT, AMBER R - 04/23/2018 15:11 EDT   Therapeutic Exercise   Therapeutic Exercise RTF :   Therapeutic Exercise  Exercise 1:  Lower extremity stretching; Supine; 2 min each LE x 2; Performed B hamstring stretches with pt in supine.  2 min each LE x 2. Pt able to achieve ~90 deg hip flx each LE with knee fully extended.       Performed Date:  04/22/2018     LORELLA, PT, AMBER R - 04/23/2018 15:11 EDT   Therapeutic Exercise Grid     Exercise 1  Exercise 2  Exercise 3      Exercise :    Lower extremity stretching   Other: hip flexor/abdominal stretching           Position :    Supine   Prone on elbows           Repetition/Time :    3x57min   5x56min           Comment :    B LE hamstring  hip IR/ER for  improved ROM with functional long sitting for components of bed mobility. Edu TA on proper technique to complete daily stretching HEP outside of therapy session. Pt with soft (muscular)end feel at [facility] hip flex, IR/ER   prone on elbow stretch to hip flexors and abdominal to improve ROM and decrease amount of stretch on musculature in supine position which is eliciting significant muscle spasms. Pt demos improved ROM and tolerance of prone on elbows following (cont)   (cont) prolonged stretch. Return to supine with no spasticity elicited.           LORELLA, PT, AMBER R - 04/23/2018 15:11 EDT  LORELLA, PT, AMBER R - 04/23/2018 15:11 EDT  BENTON, PT, AMBER R - 04/23/2018 15:11 EDT       WC Management   Type of Wheelchair :   Manual wheelchair   Wheelchair Details :   initail wheelie training with belt for posterior LOB. Pt able to obtain wheelie with min A, progressing to SPV, sustain with mod A progessing to SPV at times and occ max A to  correct. VC for hand placement and decreased ant hand translation. x 20 min   BENTON, PT, AMBER R - 04/23/2018 15:11 EDT   Wheelchair Mobility   Level Surfaces :   Rehab Modified independence   Wheelies :   Rehab Minimal assistance   BENTON, PT, AMBER R - 04/23/2018 15:11 EDT   Wheelchair Mobility Level Distance Daily :   200    BENTON, PT, AMBER R - 04/23/2018 15:11 EDT   Short Term Goals   Bed Mobility Goal Grid     Goal #1  Goal #2  Goal #3  Goal #4    Descriptors :    Supine to sit   Sit to supine   Roll to right and left   Bed mobility     Level :    Moderate assistance   Moderate assistance   Close supervision   Modified independence     Status :    Goal met   Goal met   Goal met   Initial     Date Met :    04/23/2018 EDT   04/23/2018 EDT   04/23/2018 EDT          LORELLA, PT, AMBER R - 04/23/2018 15:40 EDT  LORELLA, PT, AMBER R - 04/23/2018 15:40 EDT  BENTON, PT, AMBER R - 04/23/2018 15:40 EDT  BENTON, PT, AMBER R - 04/23/2018 15:40 EDT      Transfers Goal Grid     Goal #1  Goal #2         Descriptors :    Transfer board   Transfer board           Level :    Maximal assistance   Setup           Device :    Transfer board              Status :    Goal met   Initial           Date Met :    04/23/2018 EDT                BENTON, PT, AMBER R - 04/23/2018 15:40 EDT  BENTON, PT, AMBER R - 04/23/2018 15:40 EDT        W/C Management Grid     Goal #1  Goal #2        Descriptors :    Mobility with manual wheelchair   Pressure relief forward lean           Level :    Modified independence   Distant supervision           Distance :    300 ft              Status :    Goal met   Goal met             BENTON, PT, AMBER R - 04/23/2018 15:40 EDT  BENTON, PT, AMBER R - 04/23/2018 15:40 EDT        PT Balance Goal Grid     Goal #1  Goal #2        Descriptor :    Supported short sit   Unsupported short sit           Assist Level :    Modified independence   Close supervision           Type :    Static sitting   Static  sitting           Length of Time (minutes) :    5 minutes   5 minutes           Status :    Goal met   Goal met             BENTON, PT, AMBER R - 04/23/2018 15:40 EDT  LORELLA, PT, AMBER R - 04/23/2018 15:40 EDT        PT ST Goals Reviewed :   Chaney LORELLA, PT, AMBER R - 04/23/2018 15:40 EDT   Long Term Goals   PT LTG Reconcilation :   LTG to be met in 3 weeks   PT LT Goals Reviewed :   Yes   BENTON, PT, AMBER R - 04/23/2018 15:11 EDT   BENTON, PT, AMBER R - 04/23/2018 15:11 EDT   Outpatient PT Long Term Goals Rehab     Long Term Goal 3  Long Term Goal 1  Long Term Goal 2      Goal :    Pt will complete floor transfer with mod A in 3 weeks.    Pt will improve mobility portion of SCIM  to 17/40 to indicate improved indep with functional mobility following SCI in 3 weeks   Pt will complete car transfer mod I in 3 weeks        Status :    Initial   Progressing, continue   Initial          BENTON, PT, AMBER R - 04/23/2018 15:11 EDT  LORELLA, PT, AMBER R - 04/23/2018 15:40 EDT  BENTON, PT, AMBER R - 04/23/2018 15:40 EDT        PT Mobility Independence Measure LTG   Bed, Chair, Wheelchair Goal :   Modified independence - 6   Wheelchair Mobility Level Surfaces Goal :   Modified independence - 6   BENTON, PT, AMBER R - 04/23/2018 15:40 EDT   Type of Wheelchair Goal :   Manual wheelchair   Mode of Locomotion Goal :   Wheelchair   BENTON, PT, AMBER R - 04/23/2018 15:40 EDT   Assessment   PT Impairments or Limitations :   Balance deficits, Bed mobility deficits, Endurance deficits, Pain limiting function, Safety awareness deficits, Strength deficits, Transfer deficits, Transition deficits, Wheelchair mobility deficits   Barriers to Safe Discharge PT :   Severity of deficits   PT Treatment Recommendations :   Pt demonstrates much improved lateral transfers while maintaining ant WS with some hip clearance today, nearing SPV level. He was able to complete all aspects of bed mobility with SPV following training on proper techique. He continues to have signifcant muscle spasm when achieving full supine position, likely due to quick stretch of abdominals and hip flexors that demonstrate decreased ROM. He had improved ability to tolerate moving into supine position and will benefit from continued stretching as HEP to manage spasticity. He was able to initate wheelie training today, however continues to require skilled PT to improve indep and progress to more advanced skills. Due to his spinal cord injury, Adina is unable to ambulate due to paraplegia, and requires use of K5 manual wheelchair for adjustable axle to complete all mobility related activites of daily living inside of his home.  Pt is progressing quickly, is very motivated and will cont to benefit from skilled PT to progress indep with all functional mobility and transfers.      BENTON, PT, AMBER R -  04/23/2018 15:11 EDT   Time Spent With Patient   PT Time In :   13:00 EST   PT Time Out :   15:00 EST   PT ADL TRAINING 15 MN :   5 units   PT ADL Training Time :   75 minutes   PT Wheelchair  Management Units :   3 units   PT Wheelchair Management Time :   45 minutes   PT Total Individual Therapy Time :   120 minutes   PT Total Timed Code Treatment Units :   8 units   PT Total Timed Code Tx Minutes :   120 minutes   PT Total Treatment Time Rehab :   120 minutes   BENTON, PT, AMBER R - 04/23/2018 15:11 EDT   Additional Information   Additional Information PT :   custom K5 WC eval completed with vendor. All measurements and recomendations on file in LMN. Basic recommendation of TiLite Aero Z 18x17 Jay 3 backrest 12x16 Jay Fusion (gel) cushion 18x18     BENTON, PT, AMBER R - 04/23/2018 15:11 EDT

## 2018-04-23 NOTE — Progress Notes (Signed)
Functional Indep Measure Scores - Text       Functional Independence Measure Scores Entered On:  04/23/2018 15:02 EDT    Performed On:  04/23/2018 15:01 EDT by Raul Del, RN, Crystal N               Eating Score   Patient's Independence Level With Eating Tasks :   Independent/Modified independence   Independent or Modifications Needed, Uses or Applies Independently :   Complete independence   Functional Independence Measure Eating :   Complete independence - 7   Raul Del, RN, Crystal N - 04/23/2018 15:01 EDT   Toileting Score   Patient's Independence Level with Toileting Tasks :   Assistance   Type of Assistance Necessary :   Assistance with toileting tasks   Amount of Assistance Needed :   Adjusting clothing after toileting, Cleansing perineal area   Toileting :   Maximal assistance - 2   Raul Del, RN, [employer] - 04/23/2018 15:01 EDT   Bladder Management Score   Patient's Independence Level with Bladder Management Tasks :   Setup/Supervision   Supervision or Setup Needed :   Empty device, Gather equipment, Setup equipment   Functional Independence Measure Bladder Management :   Supervision or setup - 5   [place], RN, Crystal N - 04/23/2018 15:01 EDT   Bowel Management Score   Patient's Independence Level with Bowel Management Tasks :   Assistance   Devices/Techniques Utilized :   Digital stimulation   Type of Assistance Necessary -   Digital Stimulation :   Helper changes linen or clothing/cleans up spill   Functional Independence Measure Bowel Management :   Total assistance - 1   Raul Del, RN, Crystal N - 04/23/2018 15:01 EDT   Transfer Bed/Chair/WC Score   Patient's independence Level with Bed, Chair, Wheelchair Tasks :   Assistance   Type of Assistance Necessary :   Two helpers needed, Mechanical lift required, Total assist (patient performs less than 25%)   Bed, Chair, Wheelchair Transfer :   Total assistance - 1   Raul Del, RN, [place] - 04/23/2018 15:01 EDT   Transfer Toilet Score   Patient's Independence Level with  Transfer Toilet Tasks :   Assistance   Amount of Assistance Necessary :   Two helpers needed, Mechanical lift required, Total assist (patient performs less than 25%)   Transfer Toilet :   Total assistance - 1   Raul Del, RN, [employer] - 04/23/2018 15:01 EDT

## 2018-04-24 NOTE — Progress Notes (Signed)
Functional Indep Measure Scores - Text       Functional Independence Measure Scores Entered On:  04/24/2018 17:00 EDT    Performed On:  04/24/2018 16:59 EDT by Rozann Lesches               Transfer Bed/Chair/WC Score   Patient's independence Level with Bed, Chair, Wheelchair Tasks :   Assistance   Type of Assistance Necessary :   More assist than touching (patient performs 50% - 74% of tasks)   Bed, Chair, Wheelchair Transfer :   Moderate assistance - 3   Rozann Lesches - 04/24/2018 16:59 EDT   Walk/Wheelchair Score   Mode of Locomotion Goal :   Wheelchair   Type of Wheelchair Goal :   Manual wheelchair   Mode of Locomotion on Discharge :   Wheelchair   Patient Ambulates :   Does not occur   InM Walk Ambulation Score :   -101 ft   Does Not Occur Reason :   Patient cannot perform activity because of a medical condition or medical treatment   Functional Independence Measure Walk :   Does not occur   Assessed Locomotion in a Wheelchair :   Yes   Distance Traveled in a Wheelchair :   300 ft   Amount of Assistance Necessary :   No assistance necessary   Modified Independence Qualifiers :   Patient can independently operate a manual or motorized wheelchair for a minimum of 150 feet; turns around; maneuvers the chair to a table, bed, toilet; negotiates at least a 3 percent grade; and maneuvers on rugs and over door sills   Functional Independence Measure Wheelchair :   Modified independence - 6   Rozann Lesches - 04/24/2018 16:59 EDT

## 2018-04-24 NOTE — Nursing Note (Signed)
Medication Administration Follow Up-Text       Medication Administration Follow Up Entered On:  04/24/2018 18:10 EDT    Performed On:  04/24/2018 17:05 EDT by [place], RN, Ryanne N      Intervention Information:     ketorolac  Performed by [place], RN, Ryanne N on 04/24/2018 16:05:00 EDT       ketorolac,10mg   Oral       Med Response   ED Medication Response :   No adverse reaction, Symptoms improved   Numeric Rating Pain Scale :   1   Pasero Opioid Induced Sedation Scale :   1 = Awake and alert   [place], RN, Ryanne N - 04/24/2018 18:10 EDT
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
## 2018-04-24 NOTE — Progress Notes (Signed)
Functional Indep Measure Scores - Text       Functional Independence Measure Scores Entered On:  04/24/2018 10:52 EDT    Performed On:  04/24/2018 10:52 EDT by [employer], RN, Ryanne N               Eating Score   Patient's Independence Level With Eating Tasks :   Setup/Supervision   Supervision or Setup Needed :   Patent examiner Independence Measure Eating :   Supervision or setup - 5   [place], RN, Ryanne N - 04/24/2018 10:52 EDT   Toileting Score   Patient's Independence Level with Toileting Tasks :   Assistance   Type of Assistance Necessary :   Incidental help for contact guard or steadying   Toileting :   Minimal contact assistance - 4   [place], RN, Ryanne N - 04/24/2018 10:52 EDT   Bladder Management Score   Patient's Independence Level with Bladder Management Tasks :   Setup/Supervision   Supervision or Setup Needed :   Empty device, Gather equipment, Setup equipment, Standby prompting   Functional Independence Measure Bladder Management :   Supervision or setup - 5   [place], RN, Ryanne N - 04/24/2018 10:52 EDT   Bowel Management Score   Patient's Independence Level with Bowel Management Tasks :   Assistance   Devices/Techniques Utilized :   Digital stimulation   Type of Assistance Necessary -   Digital Stimulation :   Helper changes linen or clothing/cleans up spill   Functional Independence Measure Bowel Management :   Total assistance - 1   [place], RN, Ryanne N - 04/24/2018 10:52 EDT   Transfer Bed/Chair/WC Score   Patient's independence Level with Bed, Chair, Wheelchair Tasks :   Assistance   Type of Assistance Necessary :   Mechanical lift required   Bed, Chair, Wheelchair Transfer :   Total assistance - 1   [place], RN, Tia Masker - 04/24/2018 10:52 EDT   Transfer Toilet Score   Patient's Independence Level with Transfer Toilet Tasks :   Assistance   Amount of Assistance Necessary :   Mechanical lift required   Transfer Toilet :   Total assistance - 1   [place], RN, Ryanne N  - 04/24/2018 10:52 EDT

## 2018-04-24 NOTE — Progress Notes (Signed)
Functional Indep Measure Scores - Text       Functional Independence Measure Scores Entered On:  04/24/2018 23:28 EDT    Performed On:  04/24/2018 23:27 EDT by Jena Gauss, LPN, APRIL T               Eating Score   Patient's Independence Level With Eating Tasks :   Independent/Modified independence   Independent or Modifications Needed, Uses or Applies Independently :   Complete independence   Functional Independence Measure Eating :   Complete independence - 7   MAXWELL, LPN, APRIL T - 1/61/0960 23:27 EDT   Toileting Score   Patient's Independence Level with Toileting Tasks :   Assistance   Type of Assistance Necessary :   Requires assistance of 2 people   Toileting :   Total assistance - 1   MAXWELL, LPN, APRIL T - 4/54/0981 23:27 EDT   Bladder Management Score   Patient's Independence Level with Bladder Management Tasks :   Independent/Modified independence   Independent or Modifications Needed, Uses or Applies Independently :   Intermittent catheterization, manages independently   Functional Independence Measure Bladder Management :   Modified independence - 6   MAXWELL, LPN, APRIL T - 1/91/4782 23:27 EDT   Bowel Management Score   Patient's Independence Level with Bowel Management Tasks :   Assistance   Devices/Techniques Utilized :   Enema/Suppository   Type of Assistance Necessary - Enema/Suppository :   Helper changes linen or clothing/cleans up spill   Functional Independence Measure Bowel Management :   Total assistance - 1   MAXWELL, LPN, APRIL T - 9/56/2130 23:27 EDT   Transfer Bed/Chair/WC Score   Patient's independence Level with Bed, Chair, Wheelchair Tasks :   Assistance   Type of Assistance Necessary :   No more help than touching (patient performs 75% or more of tasks)   Bed, Chair, Wheelchair Transfer :   Minimal contact assistance - 4   MAXWELL, LPN, APRIL T - 8/65/7846 23:27 EDT
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
## 2018-04-24 NOTE — Nursing Note (Signed)
Medication Administration Follow Up-Text       Medication Administration Follow Up Entered On:  04/24/2018 14:50 EDT    Performed On:  04/24/2018 12:57 EDT by [employer], RN, Ryanne N      Intervention Information:     ketorolac  Performed by [employer], RN, Ryanne N on 04/24/2018 11:57:00 EDT       ketorolac,10mg   Oral       Med Response   ED Medication Response :   No adverse reaction   Numeric Rating Pain Scale :   0 = No pain   Pasero Opioid Induced Sedation Scale :   1 = Awake and alert   [place], RN, Ryanne N - 04/24/2018 14:50 EDT

## 2018-04-24 NOTE — Nursing Note (Signed)
Medication Administration Follow Up-Text       Medication Administration Follow Up Entered On:  04/24/2018 7:46 EDT    Performed On:  04/24/2018 6:30 EDT by Joycie Peek, RN, CECIL C      Intervention Information:     ketorolac  Performed by [employer], RN, CECIL C on 04/24/2018 05:26:00 EDT       ketorolac,10mg   Oral       Med Response   ED Medication Response :   No adverse reaction, Symptoms improved   Numeric Rating Pain Scale :   2   Pasero Opioid Induced Sedation Scale :   1 = Awake and alert   Respiratory Rate :   18 br/min   CINENSE, RN, CECIL C - 04/24/2018 7:46 EDT

## 2018-04-25 NOTE — Progress Notes (Signed)
Functional Indep Measure Scores - Text       Functional Independence Measure Scores Entered On:  04/25/2018 12:28 EDT    Performed On:  04/25/2018 12:25 EDT by Rozann Lesches               Transfer Bed/Chair/WC Score   Patient's independence Level with Bed, Chair, Wheelchair Tasks :   Assistance   Type of Assistance Necessary :   Incidental help for contact guard or steadying   Bed, Chair, Wheelchair Transfer :   Minimal contact assistance - 4   Rozann Lesches - 04/25/2018 12:25 EDT   Walk/Wheelchair Score   Mode of Locomotion Goal :   Wheelchair   Type of Wheelchair Goal :   Manual wheelchair   Mode of Locomotion on Discharge :   Wheelchair   Assessed Locomotion in a Wheelchair :   Yes   Distance Traveled in a Wheelchair :   150 ft   Amount of Assistance Necessary :   No assistance necessary   Modified Independence Qualifiers :   Patient cannot independently operate a manual or motorized wheelchair for a minimum of 150 feet; turns around; maneuvers the chair to a table, bed, toilet; negotiates at least a 3 percent grade; and maneuvers on rugs and over door sills   Functional Independence Measure Wheelchair :   Supervision or setup - 5   Rozann Lesches - 04/25/2018 12:25 EDT

## 2018-04-25 NOTE — Nursing Note (Signed)
Medication Administration Follow Up-Text       Medication Administration Follow Up Entered On:  04/25/2018 19:06 EDT    Performed On:  04/25/2018 11:46 EDT by Esmeralda Arthur, RN, Jan S      Intervention Information:     ketorolac  Performed by Esmeralda Arthur, RN, Jan S on 04/25/2018 10:46:00 EDT       ketorolac,10mg   Oral       Med Response   ED Medication Response :   No adverse reaction   Garner Gavel, Jan S - 04/25/2018 19:05 EDT
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
## 2018-04-25 NOTE — Nursing Note (Signed)
Medication Administration Follow Up-Text       Medication Administration Follow Up Entered On:  04/25/2018 19:05 EDT    Performed On:  04/25/2018 8:00 EDT by Esmeralda Arthur, RN, Jan S      Intervention Information:     ketorolac  Performed by Jena Gauss, LPN, APRIL T on 04/25/2018 05:40:00 EDT       ketorolac,10mg   Oral       Med Response   ED Medication Response :   No adverse reaction   Esmeralda Arthur RN, Jan S - 04/25/2018 19:05 EDT

## 2018-04-25 NOTE — Nursing Note (Signed)
Medication Administration Follow Up-Text       Medication Administration Follow Up Entered On:  04/25/2018 23:22 EDT    Performed On:  04/25/2018 23:21 EDT by Joycie Peek, RN, CECIL C      Intervention Information:     ketorolac  Performed by [employer], RN, CECIL C on 04/25/2018 22:19:00 EDT       ketorolac,10mg   Oral       Med Response   ED Medication Response :   No adverse reaction, Symptoms improved   FACES Pain Scale :   0 = No hurt   [place] Opioid Induced Sedation Scale :   S = Sleep, easy to arouse   Respiratory Rate :   18 br/min   CINENSE, RN, CECIL C - 04/25/2018 23:21 EDT

## 2018-04-25 NOTE — Progress Notes (Signed)
Functional Indep Measure Scores - Text       Functional Independence Measure Scores Entered On:  04/25/2018 22:49 EDT    Performed On:  04/25/2018 22:47 EDT by Joycie Peek, RN, CECIL C               Eating Score   Patient's Independence Level With Eating Tasks :   Independent/Modified independence   Independent or Modifications Needed, Uses or Applies Independently :   Complete independence   Functional Independence Measure Eating :   Modified independence - 6   CINENSE, RN, CECIL C - 04/25/2018 22:47 EDT   Toileting Score   Patient's Independence Level with Toileting Tasks :   Assistance   Type of Assistance Necessary :   Assistance with toileting tasks   Amount of Assistance Needed :   Cleansing perineal area   Toileting :   Moderate assistance - 3   CINENSE, RN, CECIL C - 04/25/2018 22:47 EDT   Bladder Management Score   Patient's Independence Level with Bladder Management Tasks :   Setup/Supervision   Supervision or Setup Needed :   Empty device, Gather equipment, Standby prompting   Functional Independence Measure Bladder Management :   Supervision or setup - 5   CINENSE, RN, CECIL C - 04/25/2018 22:47 EDT   Bowel Management Score   Patient's Independence Level with Bowel Management Tasks :   Assistance   Devices/Techniques Utilized :   Enema/Suppository   Type of Assistance Necessary - Enema/Suppository :   Total assist (patient performs less than 25%)   Functional Independence Measure Bowel Management :   Total assistance - 1   CINENSE, RN, CECIL C - 04/25/2018 22:47 EDT
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
## 2018-04-26 NOTE — Progress Notes (Signed)
Functional Indep Measure Scores - Text       Functional Independence Measure Scores Entered On:  04/26/2018 9:33 EDT    Performed On:  04/26/2018 9:31 EDT by Ottie Glazier, RN, Amy J               Eating Score   Patient's Independence Level With Eating Tasks :   Independent/Modified independence   Independent or Modifications Needed, Uses or Applies Independently :   Complete independence   Functional Independence Measure Eating :   Complete independence - 7   Varnum, RN, Amy J - 04/26/2018 9:31 EDT   Bladder Management Score   Patient's Independence Level with Bladder Management Tasks :   Setup/Supervision   Supervision or Setup Needed :   Empty device   Functional Independence Measure Bladder Management :   Supervision or setup - 5   Varnum, RN, Amy J - 04/26/2018 9:31 EDT   Bowel Management Score   Patient's Independence Level with Bowel Management Tasks :   Assistance   Devices/Techniques Utilized :   Enema/Suppository   Type of Assistance Necessary - Enema/Suppository :   More assist than incidental help (patient performs 50% - 74% of tasks)   Functional Independence Measure Bowel Management :   Moderate assistance - 3   Varnum, RN, Amy J - 04/26/2018 9:31 EDT   Transfer Bed/Chair/WC Score   Patient's independence Level with Bed, Chair, Wheelchair Tasks :   Assistance   Type of Assistance Necessary :   More assist than touching (patient performs 50% - 74% of tasks)   Bed, Chair, Wheelchair Transfer :   Moderate assistance - 3   Varnum, RN, Salomon Fick - 04/26/2018 9:31 EDT

## 2018-04-26 NOTE — Progress Notes (Signed)
Functional Indep Measure Scores - Text       Functional Independence Measure Scores Entered On:  04/26/2018 21:23 EDT    Performed On:  04/26/2018 21:22 EDT by Joycie Peek, RN, CECIL C               Eating Score   Patient's Independence Level With Eating Tasks :   Independent/Modified independence   Independent or Modifications Needed, Uses or Applies Independently :   Complete independence   Functional Independence Measure Eating :   Modified independence - 6   CINENSE, RN, CECIL C - 04/26/2018 21:22 EDT   Toileting Score   Patient's Independence Level with Toileting Tasks :   Assistance   Type of Assistance Necessary :   Assistance with toileting tasks   Amount of Assistance Needed :   Cleansing perineal area   Toileting :   Moderate assistance - 3   CINENSE, RN, CECIL C - 04/26/2018 21:22 EDT   Bladder Management Score   Patient's Independence Level with Bladder Management Tasks :   Setup/Supervision   Supervision or Setup Needed :   Setup equipment   Functional Independence Measure Bladder Management :   Supervision or setup - 5   Bladder Management Score Comments :   I&O cath   CINENSE, RN, CECIL C - 04/26/2018 21:22 EDT   Bowel Management Score   Patient's Independence Level with Bowel Management Tasks :   Assistance   Devices/Techniques Utilized :   Enema/Suppository   Type of Assistance Necessary - Enema/Suppository :   Helper changes linen or clothing/cleans up spill   Functional Independence Measure Bowel Management :   Total assistance - 1   Bowel Management Score Comments :   bowel program   CINENSE, RN, CECIL C - 04/26/2018 21:22 EDT

## 2018-04-26 NOTE — Progress Notes (Signed)
PT Inpatient Daily Documentation - Text       PT Inpatient Daily Documentation Entered On:  04/26/2018 16:19 EDT    Performed On:  04/26/2018 11:00 EDT by Regenia Skeeter, PT, CRISTINA A               Reason for Treatment   Subjective Statement :   Patient agreeable to PT trx. Pt remained seated in wc c all needs met after trx.     *Reason for Referral :   TSCI T7 AIS A on R/ T11 AIS A on the L with zpp through L3.   Pt suffered T11-12 burst fx, multiple rib fx's, T4-7 vertebral body fx's, R SI joint widening req ORIF, L sacral fx, L ulnar styloid fx, R ulnar fx      Precautions: TLSO OOB, NWB R LE WBAT LLE/UE, WBAT R UE ;   15HRS/week     *Chief Complaint :   Pt remains c c/o R sided LBP t/o transitions & while seated.      DICOSTANZO, PT, CRISTINA A - 04/26/2018 16:17 EDT   Review/Treatments Provided   PT Therapeutic Activity,Mobility,Balance :   Yes   PT Therapeutic Exercise :   Yes   PT Goals :   PT Short Term Goals    04/25/2018  Bed Mobility Goal #4: Bed mobility; Mod I; Progressing, continue  Transfer Goal #2: Transfer board; Setup; Progressing, continue  Wheelchair Management Goal #3: Manual wheelchair propulsion in community; Close S; Curbs; Initial  Balance Goal #3: Unsupported short sit; Mod I; Dynamic sitting balance; Improve independence with activities of daily living; Pt able to pick item up off floor.       PT Plan :   Treatment Frequency:  Daily (modified)   Performed By: Sofie Rower C  04/12/2018 16:22  Treatment Duration: 3 Performed By: Odella Aquas  04/12/2018 16:22  Planned Treatments: Balance training, Bed mobility training, Caregiver training, Equipment training, Functional training, Manual therapy, Moist heat/ice, Neuromuscular reeducation, Pain management, Patient education, Therapeutic activities, Therapeutic exercises, Transfer... Performed By: Odella Aquas  04/12/2018 16:22     DICOSTANZO, PT, CRISTINA A - 04/27/2018 15:10 EDT     Short Term Goals Reviewed :   Jeanie Sewer, PT, CRISTINA A - 04/26/2018 16:17 EDT   Physical Therapy Orders :   Physical Therapy Medical Hold - 04/21/18 15:49:00 EDT, Stop date 04/21/18 15:49:00 EDT  Physical Therapy Medical Hold - 04/16/18 11:13:00 EDT, Stop date 04/16/18 11:13:00 EDT, incontinent bowel and bladder due to bowel program not completed.  Physical Therapy Inpatient Additional Treatment Rehab - 04/12/18 16:52:55 EDT, Balance training, Bed mobility training, Caregiver training, Equipment training, Functional training, Manual therapy, Moist heat/ice, Neuromuscular reeducation, Pain management, Patient education, Therapeutic activities, Therape...  PT FIMS - 04/11/18 12:54:00 EDT, Daily     DICOSTANZO, PT, CRISTINA A - 04/27/2018 15:10 EDT     Pain Present :   Yes actual or suspected pain   DICOSTANZO, PT, CRISTINA A - 04/26/2018 16:17 EDT   Pain Assessment   Pain Location :   Other: B LE and abdominal spasms   Self Report Pain :   Numeric rating scale   DICOSTANZO, PT, CRISTINA A - 04/26/2018 16:17 EDT   Therapeutic Activities/Mobility/Balance   Functional Activity :   Therapeutic Activities  Activity 1:  Transfer training; Minimal assistance; Supported short sit; Hydrologist; Pt performed slide board transfers c min A for LE management and dec ant translation  on tsfr board. VC's for same plus fwd lean and body positioning on board. 4 reps       Performed Date:  04/25/2018  Activity 2:  Transfer training; Minimal assistance; Supported short sit; Other: edge of mat table; Pt performed lateral scooting @ eob. 6 reps L and R x 2 sets. Pt performed 1 set c min A for LE mgmt, 1 set c close S to verify pts ability to manage his LEs. Pt req min VC's for fwd lean in appropriate direction and for BUE placement       Performed Date:  04/25/2018  Activity 3:  Bed mobility; Distant S; Prone, Sidelying, Supine; Pt performed rolling sup <> R sidelying <> L sidelying <> prone c distant S. Min VC's for initiation and use of BUE to use pt's own  momentum. Once in prone. pt maintained position on elbows for 3 2 min bouts.       Performed Date:  04/25/2018  Activity 4:  Bed mobility; Close S; Supine, Supported long sit, Supported short sit; Pt performed short sit to long sit to supine using C curve technique. Close S to ensure success and min VC's for sequencing and positioning. supine to long sit via posterior propped elbows w/ distant SPV. pt able to manage B LEs off the bed from long cont       Performed Date:  04/25/2018  Activity 5:  Bed mobility; Close S; Supported long sit; Pt performed lateral scooting in long sit position and A/P scooting in ring sit to set up for rolling. 4 lateral shits L, 3 R, 3 A, 3 P. Close S for ensuring pt doesn't fall backward. VC's for recognizing position on mat table and ensuring safety.       Performed Date:  04/25/2018     Griselda Miner, CRISTINA A - 04/27/2018 15:10 EDT   PT Therapeutic Activities Grid     Activity 1  Activity 2        Activity :    Transfer training   Bed mobility           Assist :    Close supervision   Close supervision           Comment :    pt transfered wc<>plinth via transfer board with SPV, VC for maintain anterior weight shift   sit to supine on plinth with inc time to lift B LE, rolled to prone with momentum  required increased time 2* to spasms, supne to sit with SPV and increased time to lower B LE via long sit             DICOSTANZO, PT, CRISTINA A - 04/27/2018 15:10 EDT  DICOSTANZO, PT, CRISTINA A - 04/27/2018 15:10 EDT        Reassess Mobility :   Yes   DICOSTANZO, PT, CRISTINA A - 04/27/2018 15:10 EDT   PT Mobility   Mobility Grid   Roll Left :   Distant supervision   Roll Right :   Distant supervision   Roll Prone :   Distant supervision   Roll Supine :   Distant supervision   Supine to Sit :   Close supervision   Sit to Supine :   Close supervision   Scooting :   Close supervision   Sit to Stand :   Does not occur   Stand to Sit :   Does not occur   Transfer Bed to and From Chair :  Close supervision   Transfer Toilet :   Does not occur   Tub/Shower Transfer :   Does not occur   Car Transfer :   Does not occur   Floor Recovery :   Does not occur   DICOSTANZO, PT, CRISTINA A - 04/27/2018 15:10 EDT   Functional Mobility Details :   ..   DICOSTANZO, PT, CRISTINA A - 04/27/2018 15:10 EDT   Amb Ability Varied Surf/Distraction Grid   Level Surfaces :   Does not occur   Uneven Surfaces :   Does not occur   Distracting Environments :   Does not occur   Curbs :   Does not occur   Stairs :   Does not occur   Ramp :   Does not occur   DICOSTANZO, PT, CRISTINA A - 04/27/2018 15:10 EDT   Transfer Type :   Transfer board   PT Mobility Reviewed :   Yes   DICOSTANZO, PT, CRISTINA A - 04/27/2018 15:10 EDT   Therapeutic Exercise   Therapeutic Exercise RTF :   Therapeutic Exercise  Exercise 1:  Upper extremity strengthening; Prone on elbows; 10 reps x 4 sets; manual resist; pt performed 2 sets prone push ups on elbows c emphasis on scap protraction. TC's to facilitate. 2 more sets c moderate manual resistance. Hand placement at B superolateral scapular borders and scapular spine.       Performed Date:  04/25/2018  Exercise 2:  Upper extremity strengthening; Prone; 10 reps x 3 sets; Pt performed prone pushups c palms on mat and elbows moving between 90 deg flx and 30 deg flx. Emphasis to improve BUE strength and actively stretch hip flexors.       Performed Date:  04/25/2018  Exercise 3:  Upper extremity strengthening; Supported sit; 10 reps x 2 sets; TC's on upper back to facilitate fwd lean; Pt performed rear lifts seated at EOB. Emphasis on fwd lean and extending elbows + scap depression.       Performed Date:  04/25/2018     Griselda Miner, CRISTINA A - 04/27/2018 15:10 EDT   Therapeutic Exercise Grid     Exercise 1  Exercise 2        Comment :    mod/max Pertubations at trunk, able to maintain in short sit using weight shift, no B UE required   Prone/supine stretching at b LE to decrease spasms              DICOSTANZO, PT, CRISTINA A - 04/27/2018 15:10 EDT  DICOSTANZO, PT, CRISTINA A - 04/27/2018 15:10 EDT        Short Term Goals   Bed Mobility Goal Grid     Goal #1  Goal #2  Goal #3  Goal #4    Descriptors :    Supine to sit   Sit to supine   Roll to right and left   Bed mobility     Level :    Moderate assistance   Moderate assistance   Close supervision   Modified independence     Status :    Goal met   Goal met   Goal met   Progressing, continue     Date Met :    04/23/2018 EDT   04/23/2018 EDT   04/23/2018 EDT          Regenia Skeeter, PT, CRISTINA A - 04/26/2018 16:17 EDT  Regenia Skeeter, PT, CRISTINA A - 04/26/2018 16:17 EDT  Regenia Skeeter, PT, CRISTINA A -  04/26/2018 16:17 EDT  Regenia Skeeter, PT, CRISTINA A - 04/26/2018 16:17 EDT      Transfers Goal Grid     Goal #1  Goal #2        Descriptors :    Transfer board   Transfer board           Level :    Maximal assistance   Setup           Device :    Transfer board              Status :    Goal met   Progressing, continue           Date Met :    04/23/2018 EDT                DICOSTANZO, PT, CRISTINA A - 04/26/2018 16:17 EDT  Regenia Skeeter, PT, CRISTINA A - 04/26/2018 16:17 EDT        W/C Management Grid     Goal #1  Goal #2  Goal #3      Descriptors :    Mobility with manual wheelchair   Pressure relief forward lean   Manual wheelchair propulsion in community        Level :    Modified independence   Distant supervision   Close supervision        Distance :    300 ft              Details :          Curbs        Status :    Goal met   Goal met   Initial          DICOSTANZO, PT, CRISTINA A - 04/26/2018 16:17 EDT  DICOSTANZO, PT, CRISTINA A - 04/26/2018 16:17 EDT  Regenia Skeeter, PT, CRISTINA A - 04/26/2018 16:17 EDT       PT Balance Goal Grid     Goal #1  Goal #2  Goal #3      Descriptor :    Supported short sit   Unsupported short sit   Unsupported short sit        Assist Level :    Modified independence   Close supervision   Modified independence        Type :    Static sitting   Static sitting    Dynamic sitting balance        Length of Time (minutes) :    5 minutes   5 minutes           Rationale :          Improve independence with activities of daily living        Status :    Goal met   Goal met           Comments :          Pt able to pick item up off floor.          Regenia Skeeter, PT, CRISTINA A - 04/26/2018 16:17 EDT  Regenia Skeeter, PT, CRISTINA A - 04/26/2018 16:17 EDT  Regenia Skeeter, PT, CRISTINA A - 04/26/2018 16:17 EDT       PT ST Goals Reviewed :   Jeanie Sewer, PT, CRISTINA A - 04/26/2018 16:17 EDT   Education   Responsible Learner Present for Session :   Yes   Home Caregiver Name/Relationship :   girlfriend, [place]   Teaching Method :  Demonstration, Explanation   DICOSTANZO, PT, CRISTINA A - 04/26/2018 16:17 EDT   Physical Therapy Education Grid   Bed Mobility :   Verbalizes understanding, Demonstrates, Needs further teaching, Needs practice/supervision   Bed Positioning :   Verbalizes understanding, Demonstrates, Needs further teaching, Needs practice/supervision   Bed to Chair Transfers :   Verbalizes understanding, Demonstrates, Needs further teaching, Needs practice/supervision   Body Mechanics :   Verbalizes understanding, Demonstrates, Needs further teaching, Needs practice/supervision   Exercise Program :   [employer] understanding, Demonstrates, Needs further teaching, Needs practice/supervision   Spinal Cord Specific Education :   Verbalizes understanding, Demonstrates, Needs further teaching, Needs practice/supervision   DICOSTANZO, PT, CRISTINA A - 04/26/2018 16:17 EDT   Assessment   PT Impairments or Limitations :   Balance deficits, Bed mobility deficits, Endurance deficits, Pain limiting function, Safety awareness deficits, Strength deficits, Transfer deficits, Transition deficits, Wheelchair mobility deficits   Barriers to Safe Discharge PT :   Severity of deficits   Discharge Recommendations :   [facility] 3 weeks if pt able to WB through RUE soon. Otherwise PT recommending d/c home 1 week  after fam training completed to use hoyer and loaner w/c, then pt return to rehab once able to WB through RUE. Pt highly motivated. Pt very supportive    SCIM (mobility)=7/40    DME: [employer] custom w/c      PT Treatment Recommendations :   Pt progressing to SPV level with bed mobility and transfers with transfer board. Pt limited by spasms, improved after stretching. Pt highly motivated. Pt will continue to benefit from skilled PT to address deficits and progress towards goals.     DICOSTANZO, PT, CRISTINA A - 04/26/2018 16:17 EDT   Time Spent With Patient   PT Time In :   11:00 EST   PT Time Out :   12:00 EST   PT ADL TRAINING 15 MN :   4 units   PT ADL Training Time :   60 minutes   PT Total Individual Therapy Time :   60 minutes   PT Total Timed Code Treatment Units :   4 units   PT Total Timed Code Tx Minutes :   60 minutes   PT Total Treatment Time Rehab :   60 minutes   DICOSTANZO, PT, CRISTINA A - 04/26/2018 16:17 EDT

## 2018-04-26 NOTE — Progress Notes (Signed)
Functional Indep Measure Scores - Text       Functional Independence Measure Scores Entered On:  04/26/2018 16:17 EDT    Performed On:  04/26/2018 11:00 EDT by Regenia Skeeter, PT, CRISTINA A               Transfer Bed/Chair/WC Score   Patient's independence Level with Bed, Chair, Wheelchair Tasks :   Setup/Supervision   Supervision or Setup Needed :   Standby prompting   Bed, Chair, Wheelchair Transfer :   Supervision or setup - 5   [place], PT, CRISTINA A - 04/26/2018 16:16 EDT   Walk/Wheelchair Score   Mode of Locomotion Goal :   Wheelchair   Type of Wheelchair Goal :   Manual wheelchair   Mode of Locomotion on Discharge :   Wheelchair   Assessed Locomotion in a Wheelchair :   Yes   Distance Traveled in a Wheelchair :   300 ft   Amount of Assistance Necessary :   No assistance necessary   Modified Independence Qualifiers :   Patient can independently operate a manual or motorized wheelchair for a minimum of 150 feet; turns around; maneuvers the chair to a table, bed, toilet; negotiates at least a 3 percent grade; and maneuvers on rugs and over door sills   Functional Independence Measure Wheelchair :   Modified independence - 6   DICOSTANZO, PT, CRISTINA A - 04/26/2018 16:16 EDT

## 2018-04-26 NOTE — Nursing Note (Signed)
Medication Administration Follow Up-Text       Medication Administration Follow Up Entered On:  04/26/2018 7:01 EDT    Performed On:  04/26/2018 6:50 EDT by Joycie Peek, RN, CECIL C      Intervention Information:     ketorolac  Performed by [employer], RN, CECIL C on 04/26/2018 05:48:00 EDT       ketorolac,10mg   Oral       Med Response   ED Medication Response :   No adverse reaction, Symptoms improved   Numeric Rating Pain Scale :   1   Pasero Opioid Induced Sedation Scale :   1 = Awake and alert   Respiratory Rate :   18 br/min   CINENSE, RN, CECIL C - 04/26/2018 7:00 EDT

## 2018-04-26 NOTE — Nursing Note (Signed)
Medication Administration Follow Up-Text       Medication Administration Follow Up Entered On:  04/26/2018 11:34 EDT    Performed On:  04/26/2018 11:34 EDT by Ottie Glazier, RN, Amy J      Intervention Information:     ketorolac  Performed by Ottie Glazier, RN, Amy J on 04/26/2018 11:04:00 EDT       ketorolac,10mg   Oral       Med Response   ED Medication Response :   No adverse reaction   Ottie Glazier, RN, Amy J - 04/26/2018 11:34 EDT

## 2018-04-26 NOTE — Nursing Note (Signed)
Medication Administration Follow Up-Text       Medication Administration Follow Up Entered On:  04/26/2018 16:34 EDT    Performed On:  04/26/2018 16:34 EDT by Ottie Glazier, RN, Amy J      Intervention Information:     ketorolac  Performed by Ottie Glazier, RN, Amy J on 04/26/2018 16:23:00 EDT       ketorolac,10mg   Oral       Med Response   ED Medication Response :   No adverse reaction   Ottie Glazier, RN, Amy J - 04/26/2018 16:34 EDT

## 2018-04-27 NOTE — Progress Notes (Signed)
Functional Indep Measure Scores - Text       Functional Independence Measure Scores Entered On:  04/27/2018 8:55 EDT    Performed On:  04/27/2018 8:53 EDT by Ottie Glazier, RN, Amy J               Eating Score   Patient's Independence Level With Eating Tasks :   Independent/Modified independence   Independent or Modifications Needed, Uses or Applies Independently :   Complete independence   Functional Independence Measure Eating :   Complete independence - 7   [place], RN, Amy J - 04/27/2018 8:53 EDT   Bladder Management Score   Patient's Independence Level with Bladder Management Tasks :   Assistance   Device/Techniques Utilized :   Catheter   Type of Assistance Necessary - Catheter :   No more help than touching (patient performs 75% or more of tasks)   Functional Independence Measure Bladder Management :   Minimal contact assistance - 4   [place], RN, Amy J - 04/27/2018 8:53 EDT   Bowel Management Score   Patient's Independence Level with Bowel Management Tasks :   Assistance   Devices/Techniques Utilized :   Enema/Suppository   Type of Assistance Necessary - Enema/Suppository :   More assist than incidental help (patient performs 50% - 74% of tasks)   Functional Independence Measure Bowel Management :   Moderate assistance - 3   [place], RN, Amy J - 04/27/2018 8:53 EDT   Transfer Bed/Chair/WC Score   Patient's independence Level with Bed, Chair, Wheelchair Tasks :   Assistance   Type of Assistance Necessary :   No more help than touching (patient performs 75% or more of tasks), More assist than touching (patient performs 50% - 74% of tasks)   Bed, Chair, Wheelchair Transfer :   Moderate assistance - 3   [place], RN, Salomon Fick - 04/27/2018 8:53 EDT

## 2018-04-27 NOTE — Nursing Note (Signed)
Medication Administration Follow Up-Text       Medication Administration Follow Up Entered On:  04/27/2018 17:34 EDT    Performed On:  04/27/2018 17:34 EDT by Ottie Glazier, RN, Amy J      Intervention Information:     ketorolac  Performed by Ottie Glazier, RN, Amy J on 04/27/2018 17:14:00 EDT       ketorolac,10mg   Oral       Med Response   ED Medication Response :   No adverse reaction   Ottie Glazier, RN, Amy J - 04/27/2018 17:34 EDT

## 2018-04-27 NOTE — Nursing Note (Signed)
Medication Administration Follow Up-Text       Medication Administration Follow Up Entered On:  04/27/2018 11:51 EDT    Performed On:  04/27/2018 11:51 EDT by Ottie Glazier, RN, Amy J      Intervention Information:     ketorolac  Performed by Ottie Glazier, RN, Amy J on 04/27/2018 11:06:00 EDT       ketorolac,10mg   Oral       Med Response   ED Medication Response :   No adverse reaction   Ottie Glazier, RN, Amy J - 04/27/2018 11:51 EDT
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
## 2018-04-27 NOTE — Nursing Note (Signed)
Medication Administration Follow Up-Text       Medication Administration Follow Up Entered On:  04/27/2018 7:09 EDT    Performed On:  04/27/2018 7:09 EDT by Ottie Glazier, RN, Amy J      Intervention Information:     ketorolac  Performed by Joycie Peek, RN, CECIL C on 04/27/2018 06:11:00 EDT       ketorolac,10mg   Oral       Med Response   ED Medication Response :   No adverse reaction   Varnum, RN, Amy J - 04/27/2018 7:09 EDT

## 2018-04-28 NOTE — Nursing Note (Signed)
Medication Administration Follow Up-Text       Medication Administration Follow Up Entered On:  04/28/2018 7:15 EDT    Performed On:  04/28/2018 7:15 EDT by Ottie Glazier, RN, Amy J      Intervention Information:     ketorolac  Performed by Synthia Innocent on 04/28/2018 05:50:00 EDT       ketorolac,10mg   Oral       Med Response   ED Medication Response :   No adverse reaction   Ottie Glazier, RN, Amy J - 04/28/2018 7:15 EDT
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
## 2018-04-28 NOTE — Progress Notes (Signed)
Functional Indep Measure Scores - Text       Functional Independence Measure Scores Entered On:  04/28/2018 16:57 EDT    Performed On:  04/28/2018 13:00 EDT by Josefa Half, PT, [place]               Transfer Bed/Chair/WC Score   Patient's independence Level with Bed, Chair, Wheelchair Tasks :   Setup/Supervision   Supervision or Setup Needed :   Cueing   Bed, Chair, Wheelchair Transfer :   Supervision or setup - 5   [place], [place], [place] - 04/28/2018 16:55 EDT   Walk/Wheelchair Score   Mode of Locomotion Goal :   Wheelchair   Type of Wheelchair Goal :   Manual wheelchair   Mode of Locomotion on Discharge :   Wheelchair   Assessed Locomotion in a Wheelchair :   Yes   Distance Traveled in a Wheelchair :   300 ft   Amount of Assistance Necessary :   No assistance necessary   Modified Independence Qualifiers :   Patient cannot independently operate a manual or motorized wheelchair for a minimum of 150 feet; turns around; maneuvers the chair to a table, bed, toilet; negotiates at least a 3 percent grade; and maneuvers on rugs and over door sills   Functional Independence Measure Wheelchair :   Supervision or setup - 5   [place], PT, [place] - 04/28/2018 16:55 EDT

## 2018-04-28 NOTE — Progress Notes (Signed)
Functional Indep Measure Scores - Text       Functional Independence Measure Scores Entered On:  04/28/2018 12:19 EDT    Performed On:  04/28/2018 12:17 EDT by [name]               Bathing Score   Patient's Independence Level with Bathing Tasks :   Assistance   Type of Assistance Necessary :   Assistance with bathing body parts   Body Parts Assessed :   All body surfaces   Tasks Requiring Assistance :   Buttocks   Functional Independence Measure Bathing :   Minimal contact assistance - 4   [name] - 04/28/2018 12:17 EDT   Upper Body Dressing Score   Patient's Independence Level with Upper Body Dressing Tasks :   Setup/Supervision   Supervision or Setup Needed :   Gather clothes   UE Dressing :   Supervision or setup - 5   [name] - 04/28/2018 12:17 EDT   Lower Body Dressing Score   Patient's independence Level with Lower Body Dressing Tasks :   Setup/Supervision   Supervision or Setup Needed :   Gather clothes   LE Dressing :   Supervision or setup - 5   [name] - 04/28/2018 12:17 EDT   Toileting Score   Patient's Independence Level with Toileting Tasks :   Assistance   Type of Assistance Necessary :   Assistance with toileting tasks   Amount of Assistance Needed :   Cleansing perineal area   Toileting :   Moderate assistance - 3   [name] - 04/28/2018 12:17 EDT   Transfer Toilet Score   Patient's Independence Level with Transfer Toilet Tasks :   Assistance   [name] - 04/28/2018 15:52 EDT   Amount of Assistance Necessary :   Lifting or lowering patient   Transfer Toilet :   Moderate assistance - 3   Roby Lofts - 04/28/2018 16:08 EDT   Transfer Shower Score   Patient's independence Level with Transfer Shower Tasks :   Assistance   [name] - 04/28/2018 12:17 EDT   Type of Assistance Necessary :   Lifting or lowering patient   Shower Transfer :   Moderate assistance - 3   [name], OT, ELIZABETH G - 04/28/2018 16:08 EDT   Comprehension Score   Comprehends Complex or  Abstract Information Without Prompting or Cueing :   Yes   Mode of Comprehension :   Auditory   Understands Complex or Abstract Directions and Conversations :   At all times   Functional Independence Measure Comprehension :   Complete independence - 7   [name] - 04/28/2018 12:17 EDT   Expression Score   Expresses Complex or Abstract Information Without Prompting or Cueing :   Yes   Expression Mode :   Vocal   Expresses Complex or Abstract Ideas :   Clearly and fluently at all times   Functional Independence Measure Expression :   Complete independence - 7   [name] - 04/28/2018 12:17 EDT   Social Interaction Score   Interacts Appropriately Without Supervision :   Yes   Interacts Appropriately :   At all times   Functional Independence Measure Social Interaction :   Complete independence - 7   [name] - 04/28/2018 12:17 EDT   Problem Solving Score   Solves Complex Problems :   Yes  Ability to Solve Complex Problems :   Consistently solves problems independently   Functional Independence Measure Problem Solving :   Complete independence - 7   [name] - 04/28/2018 12:17 EDT   Memory Score   Memory Score Comments :   FIMs reviewed by [name], [name]   [name], OT, [name] - 04/28/2018 16:08 EDT   Recognizes, Remembers Routines, and Executes Requests Without Prompting :   Yes   Remembers and Executes Requests :   Consistently without need for repetition   Functional Independence Measure Memory :   Complete independence - 7   [name] - 04/28/2018 12:17 EDT

## 2018-04-28 NOTE — Progress Notes (Signed)
Functional Indep Measure Scores - Text       Functional Independence Measure Scores Entered On:  04/28/2018 9:23 EDT    Performed On:  04/28/2018 9:21 EDT by Ottie Glazier, RN, Amy J               Eating Score   Patient's Independence Level With Eating Tasks :   Independent/Modified independence   Independent or Modifications Needed, Uses or Applies Independently :   Complete independence   Functional Independence Measure Eating :   Complete independence - 7   [place], RN, Amy J - 04/28/2018 9:21 EDT   Bladder Management Score   Patient's Independence Level with Bladder Management Tasks :   Setup/Supervision   Supervision or Setup Needed :   Empty device, Gather equipment   Functional Independence Measure Bladder Management :   Supervision or setup - 5   [place], RN, Amy J - 04/28/2018 9:21 EDT   Bowel Management Score   Patient's Independence Level with Bowel Management Tasks :   Assistance   Devices/Techniques Utilized :   Enema/Suppository   Type of Assistance Necessary - Enema/Suppository :   More assist than incidental help (patient performs 50% - 74% of tasks)   Functional Independence Measure Bowel Management :   Moderate assistance - 3   [place], RN, Amy J - 04/28/2018 9:21 EDT   Transfer Bed/Chair/WC Score   Patient's independence Level with Bed, Chair, Wheelchair Tasks :   Assistance   Type of Assistance Necessary :   No more help than touching (patient performs 75% or more of tasks)   Bed, Chair, Wheelchair Transfer :   Minimal contact assistance - 4   [place], RN, Amy J - 04/28/2018 9:21 EDT

## 2018-04-28 NOTE — Nursing Note (Signed)
Medication Administration Follow Up-Text       Medication Administration Follow Up Entered On:  04/28/2018 12:22 EDT    Performed On:  04/28/2018 12:22 EDT by Ottie Glazier, RN, Amy J      Intervention Information:     ketorolac  Performed by Ottie Glazier, RN, Amy J on 04/28/2018 11:04:00 EDT       ketorolac,10mg   Oral       Med Response   ED Medication Response :   No adverse reaction   Ottie Glazier, RN, Amy J - 04/28/2018 12:22 EDT

## 2018-04-28 NOTE — Nursing Note (Signed)
Medication Administration Follow Up-Text       Medication Administration Follow Up Entered On:  04/28/2018 16:20 EDT    Performed On:  04/28/2018 16:20 EDT by Ottie Glazier, RN, Amy J      Intervention Information:     ketorolac  Performed by Ottie Glazier, RN, Amy J on 04/28/2018 15:49:00 EDT       ketorolac,10mg   Oral       Med Response   ED Medication Response :   No adverse reaction   Ottie Glazier, RN, Amy J - 04/28/2018 16:20 EDT

## 2018-04-28 NOTE — Nursing Note (Signed)
Medication Administration Follow Up-Text       Medication Administration Follow Up Entered On:  04/28/2018 16:20 EDT    Performed On:  04/28/2018 16:20 EDT by Ottie Glazier, RN, Amy J      Intervention Information:     acetaminophen  Performed by Ottie Glazier, RN, Amy J on 04/28/2018 15:49:00 EDT       acetaminophen,650mg   Oral,mild pain (1-3)       Med Response   ED Medication Response :   No adverse reaction   Ottie Glazier, RN, Amy J - 04/28/2018 16:20 EDT

## 2018-04-28 NOTE — Progress Notes (Signed)
Interdisciplinary Team Conference - Text       Interdisciplinary Team Conference PF Entered On:  04/28/2018 8:41 EDT    Performed On:  04/28/2018 8:40 EDT by Ottie Glazier, RN, Amy J               Team Members   Care Manager :   Melonie Florida   Nurse :   Grant Ruts   Occupational Therapist :   Roby Lofts   Physical Therapist :   Ulice Dash, MEGAN A   Rehab Physician :   Volney Presser   Present at Conference Rec Therapist :   Tory Emerald   Present at Conference Psychologist :   Izell Carolina, PhD, Deatra Ina, PT, MEGAN A - 04/29/2018 12:09 EDT   Primary Recreational Therapist :   Eustace Quail - 04/29/2018 11:56 EDT   Primary Care Manager :   Evlyn Kanner, RN, Amy J - 04/28/2018 8:40 EDT   Primary Nurse :   Jimmey Ralph, RN, Ladell Pier, RN, Megan A - 04/29/2018 10:06 EDT     Primary OT :   Roby Lofts   Primary PT :   Eliseo Gum, PT, AMBER R   Primary SLP :   Hilda Blades, SLP, Cindee Salt, RN, Amy J - 04/28/2018 8:40 EDT   Team Conference Date :   04/29/2018 EDT   SPENCER, PT, MEGAN A - 04/29/2018 12:09 EDT     Nursing Summary.   Bowel Level of Assistance Interim :   Total assistance   Jimmey Ralph, RN, Aundra Millet A - 04/29/2018 10:06 EDT     Bowel Management :   Incontinent   Bowel Program :   Digital stimulation, Manual removal, Suppository   Varnum, RN, Amy J - 04/28/2018 8:40 EDT   Bowel Movement Last Date :   04/29/2018 EDT     Bladder Level of Assistance Interim :   Total assistance     Bladder Management :   Incontinent     Urinary Elimination Management :   Disposable brief, Intermittent straight catheter   Jimmey Ralph, RN, Megan A - 04/29/2018 10:06 EDT     Tracheostomy Information :   No qualifying data available.       Jimmey Ralph, RN, Megan A - 04/29/2018 10:10 EDT       Number Times Suctioned This Shift :   0    Varnum, RN, Amy J - 04/28/2018 8:40 EDT   Progress Achieved This Week :   Patient has leaked urine at least x3 today. He is cathing  appropriately. Urine is dark amber and strong smell. Last UA was on 5/5 and it was negative.   Jimmey Ralph, RN, Megan A - 04/29/2018 10:10 EDT       Nursing Team Notes Current :   Yes   Varnum, RN, Amy J - 04/28/2018 8:40 EDT   OT Basic ADL   Basic ADL Grid   Eating :   Supervision or setup   Grooming :   Modified independence   Bathing :   Minimal contact assistance   UE Dressing :   Supervision or setup   LE Dressing :   Supervision or setup   Toileting :   Moderate assistance   Transfer Toilet :   Moderate assistance   Tub Transfer :   Does not occur  Shower Transfer :   Moderate assistance   [name], OT, ELIZABETH G - 04/29/2018 12:08 EDT   ADL Comments :   5/20: Pt min A sliding board tfr EOB > w/c. Incontinent BM occured once sitting up. Pt mod A w/c > tub transfer bench. Pt completed shower sitting on tub transfer bench req A only for washing buttocks. Pt mod A shower bench > w/c > EOB.  Pt completed UB/LB dressing in bed c setup. Pt mod A for toileting d/t A for cleaning buttocks after BM. in PM - pt min A w/c <> tub transfer bench. Incontinent BM occured. Pt mod A squat pivot c grab bars w/c > toilet. Pt able to reach to buttocks to aid in cleaning but still required A.      OT ADL Reviewed :   Yes   [name], OT, ELIZABETH G - 04/29/2018 12:08 EDT   OT Short Term Goals.   Upper Body Dressing Short Term Goal Grid     Goal #1          Activity :    Don/Doff pull over shirt              Assist :    Setup              Status :    Goal met              Date Met :    04/18/2018 EDT              Comment :    5/10- Pt able to don shirt in supine by rolling side to side.                [name], OT, ELIZABETH G - 04/29/2018 12:08 EDT         Lower Body Dressing Grid     Goal #1  Goal #2  Goal #3  Goal #4    Activity :    Don/Doff pants, Don/Doff underwear   Don/Doff socks   Don/Doff shoes   Don/Doff pants, Don/Doff underwear     Assist :    Moderate assistance   Moderate assistance   Moderate assistance   Minimal assistance      Status :    Goal met   Goal met   Progressing, continue   Goal met     Date Met :    04/17/2018 EDT         04/22/2018 EDT     Comment :       5/8 - pt able to don/doff socks in supported long sit by pulling foot up and ontop of other leg.             Marzella Schlein, OT, ELIZABETH G - 04/29/2018 12:08 EDT  Marzella Schlein, OT, ELIZABETH G - 04/29/2018 12:08 EDT  Marzella Schlein, OT, [name] - 04/29/2018 12:08 EDT  [name], OT, ELIZABETH G - 04/29/2018 12:08 EDT        Goal #5  Goal #6        Activity :    Don/Doff pants, Don/Doff underwear   Don/Doff socks           Assist :    Close supervision   Close supervision           Status :    Goal met   Goal met           Date Met :    04/23/2018 EDT  04/28/2018 EDT           Comment :                    Roby Lofts - 04/29/2018 12:08 EDT  [name], OT, [name] - 04/29/2018 12:08 EDT        Bathing Goal Grid     Goal #1  Goal #2  Goal #3  Goal #4    Activity :    Sponge bath   Shower transfer   Bathe   Shower transfer     Assist :    Minimal assistance   Moderate assistance   Setup   Minimal assistance     Status :    Goal met   Goal met   Progressing, continue   Initial     Date Met :    04/18/2018 EDT   04/28/2018 EDT           Comment :    Goal met at shower level with long handled sponge.   5/20- pt min A d/t pt transfering w/c > tub bench   5/20- pt min A d/t A c buttocks          [name], OT, ELIZABETH G - 04/29/2018 12:08 EDT  Marzella Schlein, OT, ELIZABETH G - 04/29/2018 12:08 EDT  [name], OT, [name] - 04/29/2018 12:08 EDT  [name], OT, [name] - 04/29/2018 12:08 EDT      Toileting and Transfers Goal Grid     Goal #1  Goal #2  Goal #3      Activity :    Bladder care   Toilet transfers   Toileting        Assist :    Minimal assistance   Minimal assistance   Minimal assistance        Equipment :    Other: Catheter              Status :    Goal met   Progressing, continue   Initial        Date Met :    04/17/2018 EDT              Comment :    based on reliable pt report, performing with min A  from nursing.   To drop arm commode or std toilet via SB.             [name], OT, ELIZABETH G - 04/29/2018 12:08 EDT  [name], OT, [name] - 04/29/2018 12:08 EDT  [name], OT, [name] - 04/29/2018 12:08 EDT       OT Balance Goal Grid     Goal #1  Goal #2        Descriptors :       Unsupported short sit           Type :    Dynamic sitting balance   Dynamic sitting balance           Assist :    Minimal assistance   Distant supervision           Length of Time (minutes) :    5 minutes   15 minutes           Rationale :    Improve independence with activities of daily living   Improve independence with activities of daily living           Status :    Goal met  Progressing, continue           Date Met :    04/17/2018 EDT                [name], OT, ELIZABETH G - 04/29/2018 12:08 EDT  Marzella Schlein, OT, [name] - 04/29/2018 12:08 EDT        OT STG Reviewed :   Verne Spurr, OT, ELIZABETH G - 04/29/2018 12:08 EDT   OT Long Term Goals.   OT IP Long Term Goals Grid     Long Term Goal 1          Goal :    Pt will complete fxl mobility task propelling w/c > 10 mins around hospital to incre endurance/ community moiblity              Status :    Progressing, continue                [name], OT, ELIZABETH G - 04/29/2018 12:08 EDT         OT ADL FIM Long Term Goals   Eating Goal :   Complete independence - 7   Grooming Goal :   Complete independence - 7   Bathing Goal :   Modified independence - 6   Upper Extremity Dressing Goal :   Modified independence - 6   Lower Body Dressing Goal :   Modified independence - 6   Toileting Goal :   Modified independence - 6   Toilet Transfer Goal :   Modified independence - 6   Tub, Shower Transfer Goal :   Modified independence - 6   [name], OT, ELIZABETH G - 04/29/2018 12:08 EDT   OT LTG Reconcilation :   Goals upgraded d/t NWB status lifting.   OT Long Term Goals Reviewed :   Verne Spurr, OT, ELIZABETH G - 04/29/2018 12:08 EDT   Occupational Therapy Summary.   Barriers to Safe Discharge OT :    Complicated medical history, Insight into deficits, Safety awareness, Severity of deficits   Additional Comments DME OT :   Continue to be limited by incontinence, bowel, bladder management and bathroom transfers.   OT Team Notes Current :   Yes   [name], OT, ELIZABETH G - 04/29/2018 12:08 EDT   PT Mobility   Mobility Grid   Roll Left :   Distant supervision   Roll Right :   Distant supervision   Roll Prone :   Distant supervision   Roll Supine :   Distant supervision   Supine to Sit :   Distant supervision   Sit to Supine :   Distant supervision   Scooting :   Close supervision   Sit to Stand :   Does not occur   Stand to Sit :   Does not occur   Transfer Bed to and From Chair :   Close supervision   Transfer Toilet :   Does not occur   Tub/Shower Transfer :   Does not occur   Car Transfer :   Rehab Minimal assistance   Floor Recovery :   Does not occur   SPENCER, PT, MEGAN A - 04/29/2018 9:51 EDT   Functional Mobility Details :   ..   SPENCER, PT, MEGAN A - 04/29/2018 9:51 EDT   Amb Ability Varied Surf/Distraction Grid   Level Surfaces :   Does not occur   Uneven Surfaces :   Does not occur  Distracting Environments :   Does not occur   Curbs :   Does not occur   Stairs :   Does not occur   Ramp :   Does not occur   SPENCER, PT, MEGAN A - 04/29/2018 9:51 EDT   Transfer Type :   Transfer board   PT Mobility Reviewed :   Yes   SPENCER, PT, MEGAN A - 04/29/2018 9:51 EDT   PT WC Management   Type of Wheelchair :   Manual wheelchair   Wheelchair Details :   Pt instructed to propel c semicircular pattern and complete the circle during recovery phase   SPENCER, PT, MEGAN A - 04/29/2018 9:51 EDT   Wheelchair Mobility Grid   Level Surfaces :   Rehab Modified independence   Uneven Surfaces :   Distant supervision   Ramps :   Rehab Minimal assistance   Wheelies :   Rehab Minimal assistance   SPENCER, PT, MEGAN A - 04/29/2018 9:51 EDT   Wheelchair Mobility Level Distance :   300 ft   Uneven Surface Wheelchair Distance :   300 ft    Wheelchair Mobility Reviewed :   Yes   SPENCER, PT, MEGAN A - 04/29/2018 9:51 EDT   PT Short Term Goals.   Bed Mobility Goal Grid     Goal #1  Goal #2  Goal #3  Goal #4    Descriptors :    Supine to sit   Sit to supine   Roll to right and left   Bed mobility     Level :    Moderate assistance   Moderate assistance   Close supervision   Modified independence     Status :    Goal met   Goal met   Goal met   Progressing, continue     Date Met :    04/23/2018 EDT   04/23/2018 EDT   04/23/2018 EDT          SPENCER, PT, MEGAN A - 04/29/2018 9:51 EDT  SPENCER, PT, MEGAN A - 04/29/2018 9:51 EDT  SPENCER, PT, MEGAN A - 04/29/2018 9:51 EDT  SPENCER, PT, MEGAN A - 04/29/2018 9:51 EDT      Transfers Goal Grid     Goal #1  Goal #2  Goal #3      Descriptors :    Press photographer board        Level :    Maximal assistance   Setup   Modified independence        Device :    Transfer board      Transfer board        Status :    Goal met   Goal met   Initial        Date Met :    04/23/2018 EDT   04/28/2018 EDT             SPENCER, PT, MEGAN A - 04/29/2018 9:51 EDT  SPENCER, PT, MEGAN A - 04/29/2018 9:51 EDT  SPENCER, PT, MEGAN A - 04/29/2018 9:51 EDT       W/C Management Grid     Goal #1  Goal #2  Goal #3      Descriptors :    Mobility with manual wheelchair   Pressure relief forward lean   Manual wheelchair propulsion in community        Level :    Modified independence  Distant supervision   Close supervision        Distance :    300 ft              Details :          Curbs        Status :    Goal met   Goal met   Progressing, continue          SPENCER, PT, MEGAN A - 04/29/2018 9:51 EDT  SPENCER, PT, MEGAN A - 04/29/2018 9:51 EDT  SPENCER, PT, MEGAN A - 04/29/2018 9:51 EDT       PT Balance Goal Grid     Goal #1  Goal #2  Goal #3      Descriptor :    Supported short sit   Unsupported short sit   Unsupported short sit        Assist Level :    Modified independence   Close supervision   Modified independence        Type :     Static sitting   Static sitting   Dynamic sitting balance        Length of Time (minutes) :    5 minutes   5 minutes           Rationale :          Improve independence with activities of daily living        Status :    Goal met   Goal met   Progressing, continue        Comments :          Pt able to pick item up off floor.          SPENCER, PT, MEGAN A - 04/29/2018 9:51 EDT  SPENCER, PT, MEGAN A - 04/29/2018 9:51 EDT  SPENCER, PT, MEGAN A - 04/29/2018 9:51 EDT       PT STG Reviewed :   Yes   SPENCER, PT, MEGAN A - 04/29/2018 9:51 EDT   PT Long Term Goals.   Outpatient PT Long Term Goals Rehab     Long Term Goal 1  Long Term Goal 2  Long Term Goal 3      Goal :    Pt will improve mobility portion of SCIM  to 17/40 to indicate improved indep with functional mobility following SCI in 3 weeks   Pt will complete car transfer mod I in 3 weeks   Pt will complete floor transfer with mod A in 3 weeks.        Status :    Progressing, continue   Initial   Initial          SPENCER, PT, MEGAN A - 04/29/2018 9:51 EDT  SPENCER, PT, MEGAN A - 04/29/2018 9:51 EDT  SPENCER, PT, MEGAN A - 04/29/2018 9:51 EDT       DCP GENERIC CODE   Bed, Chair, Wheelchair Goal :   Modified independence - 6   Wheelchair Mobility Level Surfaces Goal :   Modified independence - 6   SPENCER, PT, MEGAN A - 04/29/2018 9:51 EDT   PT LTG Reconcilation :   LTG to be met in 3 weeks   Type of Wheelchair Goal :   Manual wheelchair   Mode of Locomotion Goal :   Wheelchair   [place], PT, MEGAN A - 04/29/2018 9:51 EDT   Physical Therapy Summary.   PT Equipment Anticipated or Recommended :  Ultralightweight Manual Wheelchair   Additional Comments DME PT :   progressing, but continues to be limited by frequent incontinent BMs.   PT Team Notes Current :   Yes   SPENCER, PT, MEGAN A - 04/29/2018 9:51 EDT   Severity Level.   Comprehension Indep Measure Interim :   Complete independence - 7   Comprehension Mode :   Auditory   Expression Indep Measure Interim :   Complete  independence - 7   Expression Mode :   Vocal   Problem Solving Indep Measure Interim :   Modified independence - 6   Memory Indep Measure Interim :   Modified independence - 6   Social Interaction Indep Measure Interim :   Complete independence - 7   [name], OT, ELIZABETH G - 04/29/2018 12:08 EDT   SLP Short Term Goals.   SLP STG Reviewed :   Yes   SPENCER, PT, MEGAN A - 04/29/2018 12:09 EDT   Speech Therapy Summary.   Barriers to Safe Discharge SLP :   Severity of deficits   SLP Progress Note Current :   N/A   Additional Comments SLP Summary :   PRN SLP EVALUATED PT. AND HE SCORED 24/30 MOCA 7/1. SLP SIGNED OFF ON THIS CASE.   SPENCER, PT, MEGAN A - 04/29/2018 12:09 EDT   Psychology Summary.   Psychology Progress Notes Current :   N/A   SPENCER, PT, MEGAN A - 04/29/2018 12:09 EDT   Recreational Therapy Progress Note   Patient Recreational Therapy Goals :   Better endurance this week, managing self care- verbalizing education and needs.     Barriers- endurance, sitting balance, adjustment, adaptive skills   Goals: education, resources, community reentry/ barriers, adaptive leisure skills      Tory Emerald - 04/29/2018 11:56 EDT   Interdisciplinary Discharge Planning.   Discharge Disposition Plan :   Home w/ parents and GF    Rehab Anticipated Discharge Date :   05/06/2018 EDT   Next Level of Care :   Home Health   Team Conference Discussion RTF :   Martyn Ehrich, PT, served as scribe during team conference on this date.      Barriers to goals/Reasons for skilled intervention :   Bladder/bowel issues, Decreased endurance, Decreased sitting/standing balance, Decreased strength/ROM, Pain management issues, Safety issues, Skin integrity issues   Medical/rehab reason for continued stay :   Bowel/bladder, Brady/tachycardia, Community mobility issue, Electrolyte imbalance, Medication adjustments, Orthostatic hypotension, Pain management, Paresis, Pending labs/diagnostics, Skin integrity, Unsafe discharge environment,  Wound   SPENCER, PT, MEGAN A - 04/29/2018 12:09 EDT   Anticipated Therapy Interventions.   PT Estimated Hours per Week :   7.5 hours per week   Therapy Activity Tolerance :   15 hours over 7 days   OT Estimated Hours Per Week :   7.5 hours per week   SPENCER, PT, MEGAN A - 04/29/2018 12:09 EDT   Rehab Team Goals   Team Mobility Goals Grid     Goal #1  Goal #2        Team Mobility Goals :    Toilet transfers   Toilet transfers           Team Mobility Assist Level :    With max assist   With min assist           Team Mobility Goal Status :    Met   Initial  SPENCER, PT, MEGAN A - 04/29/2018 12:09 EDT  SPENCER, PT, MEGAN A - 04/29/2018 12:09 EDT        Team Mobility Goals 2 Grid     Goal #1  Goal #2        Team Mobility Goals :    Bed and or wheelchair transfer   Bed and or wheelchair transfer           Team Mobility Assist Level :    With max assist   Mod I           Team Mobility Goal Status :    Met   Initial             SPENCER, PT, MEGAN A - 04/29/2018 12:09 EDT  SPENCER, PT, MEGAN A - 04/29/2018 12:09 EDT        Team Pain Management Goals Grid     Goal #1          Team Pain Management Goals :    Acute pain control              Team Pain Management Goals Status :    Ongoing                SPENCER, PT, MEGAN A - 04/29/2018 12:09 EDT         Team Patient/Family Ed Goal Grid     Goal #1          Team Patient/Family Education Goals :    Participate in family teaching/education              Team Patient/Family Edu Goal Status :    Ongoing                SPENCER, PT, MEGAN A - 04/29/2018 12:09 EDT         Team Skin Surveillance/Wound Goals Grid     Goal #1          Team Skin Surveillance/Wound Care Goals :    Positioning, Skin surveillance, Dem/verb compliance w/ effective pressure relief              Skin Surveillance Wound Care Goal Status :    Initial                SPENCER, PT, MEGAN A - 04/29/2018 12:09 EDT         Team Community Re-entry goal grid     Goal #1          Team Community Re-entry Goals :     Navigational skills, Safety awareness, Dentist Re-entry Assist Level :    Mod I              Team Community Re-entry Goal Status :    Initial                SPENCER, PT, MEGAN A - 04/29/2018 12:09 EDT
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
## 2018-04-28 NOTE — Progress Notes (Signed)
Functional Indep Measure Scores - Text       Functional Independence Measure Scores Entered On:  04/28/2018 3:58 EDT    Performed On:  04/28/2018 3:57 EDT by Azucena Kuba, RN, VANESSA               Toileting Score   Patient's Independence Level with Toileting Tasks :   Assistance   Type of Assistance Necessary :   Assistance with toileting tasks   Amount of Assistance Needed :   Cleansing perineal area   Toileting :   Moderate assistance - 3   REID, RN, [place] Noe - 04/28/2018 3:57 EDT   Bladder Management Score   Patient's Independence Level with Bladder Management Tasks :   Assistance   Device/Techniques Utilized :   Catheter   Type of Assistance Necessary - Catheter :   No more help than touching (patient performs 75% or more of tasks)   Functional Independence Measure Bladder Management :   Minimal contact assistance - 4   REID, RN, VANESSA - 04/28/2018 3:57 EDT   Bowel Management Score   Patient's Independence Level with Bowel Management Tasks :   Assistance   Devices/Techniques Utilized :   Enema/Suppository   Type of Assistance Necessary - Enema/Suppository :   Total assist (patient performs less than 25%)   Functional Independence Measure Bowel Management :   Total assistance - 1   REID, RN, VANESSA - 04/28/2018 3:57 EDT
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
## 2018-04-29 NOTE — Progress Notes (Signed)
Functional Indep Measure Scores - Text       Functional Independence Measure Scores Entered On:  04/29/2018 10:33 EDT    Performed On:  04/29/2018 10:33 EDT by Jimmey Ralph, RN, Megan A               Eating Score   Patient's Independence Level With Eating Tasks :   Independent/Modified independence   Independent or Modifications Needed, Uses or Applies Independently :   Complete independence   Functional Independence Measure Eating :   Complete independence - 7   Parker, RN, Aundra Millet A - 04/29/2018 10:33 EDT   Toileting Score   Patient's Independence Level with Toileting Tasks :   Assistance   Type of Assistance Necessary :   Assistance with toileting tasks   Amount of Assistance Needed :   Adjusting clothing before toileting, Adjusting clothing after toileting, Cleansing perineal area   Toileting :   Total assistance - 1   Jimmey Ralph, RN, Aundra Millet A - 04/29/2018 10:33 EDT   Bladder Management Score   Patient's Independence Level with Bladder Management Tasks :   Assistance   Device/Techniques Utilized :   Pull-Up/Brief/Pad   Type of Assistance Necessary - Pull-Up/Brief/Pad :   Helper changes linen or clothing/cleans up spill   Functional Independence Measure Bladder Management :   Total assistance - 1   Jimmey Ralph, RN, Aundra Millet A - 04/29/2018 10:33 EDT   Bowel Management Score   Patient's Independence Level with Bowel Management Tasks :   Assistance   Devices/Techniques Utilized :   Enema/Suppository   Type of Assistance Necessary - Enema/Suppository :   Helper changes linen or clothing/cleans up spill   Functional Independence Measure Bowel Management :   Total assistance - 1   Jimmey Ralph, RN, Aundra Millet A - 04/29/2018 10:33 EDT   Transfer Bed/Chair/WC Score   Patient's independence Level with Bed, Chair, Wheelchair Tasks :   Assistance   Type of Assistance Necessary :   Incidental help for contact guard or steadying   Bed, Chair, Wheelchair Transfer :   Minimal contact assistance - 4   Jimmey Ralph, RN, Aundra Millet A - 04/29/2018 10:33 EDT   Transfer Toilet Score    Patient's Independence Level with Transfer Toilet Tasks :   Assistance   Amount of Assistance Necessary :   Incidental help for contact guard or steadying   Transfer Toilet :   Minimal contact assistance - 4   Jimmey Ralph, RN, Aundra Millet A - 04/29/2018 10:33 EDT

## 2018-04-29 NOTE — Progress Notes (Signed)
Therapeutic Recreation Progress - Text       Therapeutic Recreation Progress Note Entered On:  04/29/2018 18:54 EDT    Performed On:  04/29/2018 18:51 EDT by Tory Emerald               Progress Note   Leisure Skill Activity #4     Leisure Skill Activity #1  Leisure Skill Activity #2        Therapeutic Focus :    Activity modification, Adaptive equipment training   Activity endurance, Sitting balance, Sitting tolerance           Level of Assistance :    Modified independence   Modified independence           Sessions Needed :    2 TX session   3 TX session             Tory Emerald - 04/29/2018 18:51 EDT  Tory Emerald - 04/29/2018 18:51 EDT        Community Reintegration Grid     Community Reintegration Activity #1  Community Reintegration Activity #2        Therapeutic Focus :    Resources   Barriers, Problem solving, Wheelchair mobility           Level of Assistance :    Complete independence   Modified independence           Sessions Needed :    2 TX session   2 [place] session             Tory Emerald - 04/29/2018 18:51 EDT  Tory Emerald - 04/29/2018 18:51 EDT        Social Interaction Grid     Social Interaction Activity #1          Therapeutic Focus :    Social support, Other: adjustment              Level of Assistance :    Complete independence              Sessions Needed :    2 TX session              Goal Status :    Dixon Boos - 04/29/2018 18:51 EDT         Leisure Participation Grid     Initiation Leisure Activity #1          Therapeutic Focus :    Activity pattern development at home              Level of Assistance :    Modified independence              Sessions Needed :    1 TX session                Tory Emerald - 04/29/2018 18:51 EDT         Summary   Actual Deficits :   Adjustment, Leisure awareness, LEU use, Sitting balance   Perceived Deficits :   Coping   Planned Treatments :   Adaptive skills, Adjustment, Barriers, Community reentry,  Other: resources   Planned Frequency :   2-3 times per week   Planned Duration :   3-4 weeks   Overall Progress :   pt seen bedside 2*  wc needing repairs/ off unit w DME person. CTRS enrolled pt with national SCI organization, given resources for [employer]. Encouraged to watch videos on adaptive techniques for daily life skills by another person with paraplegia. Pt responsive to information, reporting interest and understanding. Cont. POC     Tory Emerald - 04/29/2018 18:51 EDT   TR Charges   TR Time In :   13:30 EST   TR Time Out :   14:00 EST   TR Individualized Units :   2 units   TR Chart Total Treatment Time Units :   2 units   TR Daily Total Treatment Time Units :   2 units   Tory Emerald - 04/29/2018 18:51 EDT

## 2018-04-29 NOTE — Progress Notes (Signed)
Functional Indep Measure Scores - Text       Functional Independence Measure Scores Entered On:  04/29/2018 13:31 EDT    Performed On:  04/29/2018 13:31 EDT by Karleen Hampshire, PT, MEGAN A               Transfer Bed/Chair/WC Score   Patient's independence Level with Bed, Chair, Wheelchair Tasks :   Setup/Supervision   Supervision or Setup Needed :   Cueing, Standby prompting   Bed, Chair, Wheelchair Transfer :   Supervision or setup - 5   [place], PT, MEGAN A - 04/29/2018 13:31 EDT   Walk/Wheelchair Score   Mode of Locomotion Goal :   Wheelchair   Type of Wheelchair Goal :   Manual wheelchair   Mode of Locomotion on Discharge :   Wheelchair   Assessed Locomotion in a Wheelchair :   Yes   Distance Traveled in a Wheelchair :   150 ft   Amount of Assistance Necessary :   No assistance necessary   Modified Independence Qualifiers :   Patient can independently operate a manual or motorized wheelchair for a minimum of 150 feet; turns around; maneuvers the chair to a table, bed, toilet; negotiates at least a 3 percent grade; and maneuvers on rugs and over door sills   Functional Independence Measure Wheelchair :   Modified independence - 6   SPENCER, PT, MEGAN A - 04/29/2018 13:31 EDT

## 2018-04-29 NOTE — Nursing Note (Signed)
Medication Administration Follow Up-Text       Medication Administration Follow Up Entered On:  04/29/2018 6:47 EDT    Performed On:  04/29/2018 6:47 EDT by Synthia Innocent      Intervention Information:     ketorolac  Performed by Synthia Innocent on 04/29/2018 05:43:00 EDT       ketorolac,10mg   Oral       Med Response   ED Medication Response :   No adverse reaction, Symptoms improved   Numeric Rating Pain Scale :   2   [place] Opioid Induced Sedation Scale :   1 = Awake and alert   Synthia Innocent - 04/29/2018 6:47 EDT

## 2018-04-29 NOTE — Progress Notes (Signed)
 PT Inpatient Daily Documentation - Text       PT Inpatient Daily Documentation Entered On:  04/29/2018 12:01 EDT    Performed On:  04/29/2018 11:50 EDT by Thurmon Marcus               Reason for Treatment   Subjective Statement :   Pt is agreeable to PT. Reports that he took some medicine this AM to help with producing bowel mvmts. No c/o L shoulder p! Pt also reports that being prone and doing prone serratus push ups tends to alleviate back spasms.      Thurmon Marcus - 04/29/2018 12:23 EDT   *Reason for Referral :   TSCI T7 AIS A on R/ T11 AIS A on the L with zpp through L3.   Pt suffered T11-12 burst fx, multiple rib fx's, T4-7 vertebral body fx's, R SI joint widening req ORIF, L sacral fx, L ulnar styloid fx, R ulnar fx      Precautions: TLSO OOB, NWB R LE WBAT LLE/UE, WBAT R UE ; TLSO brace on in prone or during activities in prone  15HRS/week     *Chief Complaint :   Pt remains c c/o R sided LBP t/o transitions & while seated.      Thurmon Marcus - 04/29/2018 11:50 EDT   Review/Treatments Provided   PT Long Term Goals Reviewed :   Yes   PT Therapeutic Exercise :   Yes   PT Wheelchair Management :   Yes   Thurmon Marcus - 04/29/2018 12:23 EDT   PT Goals :   PT Short Term Goals    04/28/2018  Bed Mobility Goal #4: Bed mobility; Mod I; Progressing, continue  Transfer Goal #3: Transfer board; Mod I; Transfer board; Initial  Wheelchair Management Goal #3: Manual wheelchair propulsion in community; Close S; Curbs; Progressing, continue  Balance Goal #3: Unsupported short sit; Mod I; Dynamic sitting balance; Improve independence with activities of daily living; Progressing, continue; Pt able to pick item up off floor.     PT Plan :   Treatment Frequency:  Daily (modified)   Performed By: Mendoza, PT, [name]  04/12/2018 16:22  Treatment Duration: 3 Performed By: Mendoza, PT, [name]  04/12/2018 16:22  Planned Treatments: Balance training, Bed mobility training, Caregiver training, Equipment training, Functional training,  Manual therapy, Moist heat/ice, Neuromuscular reeducation, Pain management, Patient education, Therapeutic activities, Therapeutic exercises, Transfer... Performed By: Mendoza, PT, [name]  04/12/2018 16:22     SPENCER, PT, MEGAN A - 04/29/2018 13:21 EDT   Short Term Goals Reviewed :   Chaney Thurmon Marcus - 04/29/2018 11:50 EDT   Physical Therapy Orders :   Physical Therapy Medical Hold - 04/21/18 15:49:00 EDT, Stop date 04/21/18 15:49:00 EDT  Physical Therapy Medical Hold - 04/16/18 11:13:00 EDT, Stop date 04/16/18 11:13:00 EDT, incontinent bowel and bladder due to bowel program not completed.  Physical Therapy Inpatient Additional Treatment Rehab - 04/12/18 16:52:55 EDT, Balance training, Bed mobility training, Caregiver training, Equipment training, Functional training, Manual therapy, Moist heat/ice, Neuromuscular reeducation, Pain management, Patient education, Therapeutic activities, Therape...  PT FIMS - 04/11/18 12:54:00 EDT, Daily     SPENCER, PT, MEGAN A - 04/29/2018 13:21 EDT   Pain Present :   Yes actual or suspected pain   PT Therapeutic Activity,Mobility,Balance :   Yes   Thurmon Marcus - 04/29/2018 11:50 EDT   Pain Assessment   Pain Location :  Back   Laterality :   Bilateral   Quality :   Other: Spasm   Time Pattern :   Intermittent   Onset :   Sudden   Self Report Pain :   Numeric rating scale   Numeric Pain Scale :   6   Numeric Pain Score :   6    Thurmon Marcus - 04/29/2018 11:50 EDT   Pain Assessment Detail   Alleviating Factors :   Other: Time after initial spasm   Aggravating Factors :   Other: movement into supine   Thurmon Marcus - 04/29/2018 11:50 EDT   Effects of Pain Grid   Lying :   Worse   Supine to Sit :   Better   Thurmon Marcus - 04/29/2018 11:50 EDT   Therapeutic Activities/Mobility/Balance   Reassess Mobility :   Yes   Thurmon Marcus - 04/29/2018 12:23 EDT   Functional Activity :   Therapeutic Activities  Activity 1:  Bed mobility; Distant S; Prone, Supine; Pt performed rolling in bed supine  to/from sidelying to/from prone c distant S and very minimal cues for sequencing.       Performed Date:  04/28/2018  Activity 2:  Transfer training; Close S; Supported short sit; Sliding board; Pt performed lateral transfer with slide board from [facility] to elevated surfaces (24-25). Close S and mod VC's for sequencing, hand placement, and body positioning; VCs for head hip relationship.       Performed Date:  04/28/2018  Activity 3:  Transfer training; Minimal assistance; Supported short sit; Hydrologist; Pt educated on and performed car transfer c min A for slide board and LE management. VC's for sequencing, hand placement, and position in car to maximize body control.       Performed Date:  04/28/2018  Activity 4:  Bed mobility; Distant S; Pt performed short sitting <> supine via c curve transfer technique c distant S and very min VC's for sequencing of movement and LE management.       Performed Date:  04/28/2018  Activity 5:  Other: WC mobility; Minimal assistance; Pt able to negotiate level surfaces 300+ ft c mod I. Req close S for negotiating uneven surfaces and min A for negotiating up high grade and low grade ramps; multiple laps of ramp outside and inside hospital.       Performed Date:  04/28/2018     JACQUES, PT, MEGAN A - 04/29/2018 13:26 EDT       PT Therapeutic Activities Grid     Activity 3  Activity 4  Activity 5  Activity 1    Activity :    Transfer training   Transfer training   Other: WC negotiation   Bed mobility     Assist :    Close supervision   Close supervision      Modified independence     Position :    Supported short sit         Prone, Prone on elbows, Sidelying, Supine     Equipment :    Sliding board              Comment :    Pt educated on and demonstrated lateral transfer in/out of the car c transfer board c close S and minimal VC's for setup, hand placement, body placement. SPENCER, PT, MEGAN A - 04/29/2018 13:26 EDT   Pt performed level slide board transfers from [facility] <-> plinth and  hospital bed  to WC c close S, VC's for set up, and min VC's for sequencing of mvmt. SPENCER, PT, MEGAN A - 04/29/2018 13:26 EDT   Worked on negotiation of curbs (using red mat), increasing height of curb as pt was successful up to ~3.  fwd and bckwd negotiation of high grade and low grade ramps, and uneven surfaces. Pt req close S for ramps and uneven surfaces. Min-modA for curbs. SPENCER, PT, MEGAN A - 04/29/2018 13:26 EDT   Pt performed all rolling and prone propping on elbows mod I with no VC's.       Thurmon Marcus - 04/29/2018 12:23 EDT  Thurmon Marcus - 04/29/2018 12:23 EDT  Thurmon Marcus - 04/29/2018 12:23 EDT  Thurmon Marcus - 04/29/2018 11:50 EDT        Activity 2          Activity :    Bed mobility              Assist :    Modified independence              Position :    Supported long sit, Supported short sit              Equipment :                  Comment :    Pt is able to perform short sit <> long sit transfer mod I without VC's.                Thurmon Marcus - 04/29/2018 12:23 EDT         PT Mobility   Mobility Grid   Roll Left :   Rehab Modified independence   Roll Right :   Rehab Modified independence   Roll Prone :   Rehab Modified independence   Roll Supine :   Rehab Modified independence   Supine to Sit :   Rehab Modified independence   Sit to Supine :   Rehab Modified independence   Scooting :   Close supervision   Sit to Stand :   Does not occur   Stand to Sit :   Does not occur   Transfer Bed to and From Chair :   Close supervision   Transfer Toilet :   Does not occur   Tub/Shower Transfer :   Does not occur   Car Transfer :   Close supervision   Floor Recovery :   Does not occur   Thurmon Marcus - 04/29/2018 12:23 EDT   Functional Mobility Details :   ..   Thurmon Marcus - 04/29/2018 12:23 EDT   Amb Ability Varied Surf/Distraction Grid   Level Surfaces :   Does not occur   Uneven Surfaces :   Does not occur   Distracting Environments :   Does not occur   Curbs :   Does not occur   Stairs :   Does not occur    Ramp :   Does not occur   Thurmon Marcus - 04/29/2018 12:23 EDT   Transfer Type :   Transfer board   PT Mobility Reviewed :   Yes   Thurmon Marcus - 04/29/2018 12:23 EDT   Therapeutic Exercise   Therapeutic Exercise RTF :   Therapeutic Exercise  Exercise 1:  Upper extremity strengthening; Prone on elbows; Manual - Mod; Pt performed prone serratus push ups on elbows 3 x 15  c mod manual resistance.       Performed Date:  04/28/2018  Exercise 2:  Prone/supine stretching at b LE to decrease spasms       Performed Date:  04/26/2018  Exercise 3:  Upper extremity strengthening; Supported sit; 10 reps x 2 sets; TC's on upper back to facilitate fwd lean; Pt performed rear lifts seated at EOB. Emphasis on fwd lean and extending elbows + scap depression.       Performed Date:  04/25/2018     JACQUES, PT, MEGAN A - 04/29/2018 13:26 EDT   SPENCER, PT, MEGAN A - 04/29/2018 13:26 EDT       Therapeutic Exercise Grid     Exercise 1  Exercise 2        Exercise :    Upper extremity strengthening   Lower extremity stretching           Position :    Prone on elbows   Other: Supported ring sit           Repetition/Time :    20 x 3   1 min x 3 sets           Comment :    Pt performed serratus pushups while in prone on elbows to improve strength in scapular mm groups.   Pt performed 3 sets hamstring stretch in ring sitting. Pt reported p! in B posterior hip when knee was flexed to ~45 deg, so we reduced knee flx angle to 20 deg and pain subsided.             Thurmon Marcus - 04/29/2018 12:23 EDT  Thurmon Marcus - 04/29/2018 12:23 EDT        WC Management   Type of Wheelchair :   Manual wheelchair   Thurmon Marcus - 04/29/2018 12:23 EDT   Thurmon Marcus - 04/29/2018 12:23 EDT   Wheelchair Mobility   Curbs :   Rehab Moderate assistance   (Comment: 2-3 on red mat in gym  [place], PT, MEGAN A - 04/29/2018 13:26 EDT] )   SPENCER, PT, MEGAN A - 04/29/2018 13:26 EDT     Level Surfaces :   Rehab Modified independence   Uneven Surfaces :   Close supervision   Ramps  :   Close supervision   Thurmon Marcus - 04/29/2018 12:23 EDT   Wheelchair Mobility Level Distance Daily :   200    Uneven Surface Distance :   50 ft   Thurmon Marcus - 04/29/2018 12:23 EDT   Short Term Goals   Bed Mobility Goal Grid     Goal #1  Goal #2  Goal #3  Goal #4    Descriptors :    Supine to sit   Sit to supine   Roll to right and left   Bed mobility     Level :    Moderate assistance   Moderate assistance   Close supervision   Modified independence     Status :    Goal met   Goal met   Goal met   Goal met     Date Met :    04/23/2018 EDT   04/23/2018 EDT   04/23/2018 EDT   04/29/2018 EDT       Thurmon Marcus - 04/29/2018 11:50 EDT  Thurmon Marcus - 04/29/2018 11:50 EDT  Thurmon Marcus - 04/29/2018 11:50 EDT  Thurmon Marcus - 04/29/2018 11:50 EDT  Transfers Goal Grid     Goal #1  Goal #2  Goal #3      Descriptors :    Press photographer board        Level :    Maximal assistance   Setup   Modified independence        Device :    Transfer board      Transfer board        Status :    Goal met   Goal met   Progressing, continue        Date Met :    04/23/2018 EDT   04/28/2018 EDT             Thurmon Marcus - 04/29/2018 11:50 EDT  Thurmon Marcus - 04/29/2018 11:50 EDT  Thurmon Marcus - 04/29/2018 11:50 EDT       W/C Management Grid     Goal #1  Goal #2  Goal #3      Descriptors :    Mobility with manual wheelchair   Pressure relief forward lean   Manual wheelchair propulsion in community        Level :    Modified independence   Distant supervision   Close supervision        Distance :    300 ft              Details :          Curbs        Status :    Goal met   Goal met   Progressing, continue          Thurmon Marcus - 04/29/2018 11:50 EDT  Thurmon Marcus - 04/29/2018 11:50 EDT  Thurmon Marcus - 04/29/2018 11:50 EDT       PT Balance Goal Grid     Goal #1  Goal #2  Goal #3      Descriptor :    Supported short sit   Unsupported short sit   Unsupported short sit        Assist Level :    Modified independence   Close  supervision   Modified independence        Type :    Static sitting   Static sitting   Dynamic sitting balance        Length of Time (minutes) :    5 minutes   5 minutes           Rationale :          Improve independence with activities of daily living        Status :    Goal met   Goal met   Progressing, continue        Comments :          Pt able to pick item up off floor.          Thurmon Marcus - 04/29/2018 11:50 EDT  Thurmon Marcus - 04/29/2018 11:50 EDT  Thurmon Marcus - 04/29/2018 11:50 EDT       PT ST Goals Reviewed :   Chaney Thurmon Marcus - 04/29/2018 11:50 EDT   Long Term Goals   Outpatient PT Long Term Goals Rehab     Long Term Goal 1  Long Term Goal 2  Long Term Goal 3      Goal :  Pt will improve mobility portion of SCIM  to 17/40 to indicate improved indep with functional mobility following SCI in 3 weeks   Pt will complete car transfer mod I in 3 weeks   Pt will complete floor transfer with mod A in 3 weeks.        Status :    Progressing, continue   Initial   Initial          Thurmon Marcus - 04/29/2018 12:23 EDT  Thurmon Marcus - 04/29/2018 12:23 EDT  Thurmon Marcus - 04/29/2018 12:23 EDT       PT Mobility Independence Measure LTG   Bed, Chair, Wheelchair Goal :   Modified independence - 6   Wheelchair Mobility Level Surfaces Goal :   Modified independence - 6   Thurmon Marcus - 04/29/2018 12:23 EDT   PT LTG Reconcilation :   LTG to be met in 3 weeks   Type of Wheelchair Goal :   Manual wheelchair   Mode of Locomotion Goal :   Wheelchair   PT LT Goals Reviewed :   Chaney Thurmon Marcus - 04/29/2018 12:23 EDT   Education   Home Caregiver Name/Relationship :   girlfriend, Diplomatic Services operational officer   Teaching Method :   Demonstration, Explanation   Thurmon Marcus - 04/29/2018 12:23 EDT   Physical Therapy Education Grid   Bed Mobility :   [employer] understanding, Demonstrates   Bed to Chair Transfers :   [employer] understanding, Needs Art gallery manager Transfers :   TEFL teacher understanding, Needs practice/supervision   Exercise Program  :   [employer] understanding, Needs further teaching, Needs practice/supervision   Physical Therapy Plan of Care :   Verbalizes understanding, Needs further teaching   Wheelchair Positioning :   Verbalizes understanding, Needs practice/supervision   Thurmon Marcus - 04/29/2018 12:23 EDT   LE ROM/Strength   LE Overall Range of Motion Grid   Left Lower Extremity Passive Range :   Within functional limits   Right Lower Extremity Passive Range :   Within functional limits   Thurmon Marcus - 04/29/2018 12:23 EDT   Assessment   PT Impairments or Limitations :   Balance deficits, Bed mobility deficits, Endurance deficits, Pain limiting function, Safety awareness deficits, Strength deficits, Transfer deficits, Transition deficits, Wheelchair mobility deficits   Barriers to Safe Discharge PT :   Severity of deficits   Discharge Recommendations :   ELOS 3 weeks if pt able to WB through RUE soon. Otherwise PT recommending d/c home 1 week after fam training completed to use hoyer and loaner w/c, then pt return to rehab once able to WB through RUE. Pt highly motivated. Pt very supportive    SCIM (mobility)=7/40    DME: [employer] custom w/c      Thurmon Marcus - 04/29/2018 12:23 EDT   PT Treatment Recommendations :   Pt participated well today in PT, however, was limited due to 2 episodes of bowel incontinence and one episode of bladder incontinence. Pt was able to perform all bed mobility mod I, achieving his bed mobility goal. Pt demostrated ability to perform car transfer c slide board and close S. Will continue to work on this in future sessions. Pt is progressing well through training on curb negotiation, but requires intermittent min <> mod A for regaining stability after initial tilt. Pt is demonstrating proper body position c ramp negotation fwd and bckwd, but occasionally requires intermittent VC's as a  reminder. Pt will continue to require skilled inpatient PT to address WC mobility, improve independence in transfers,  improve BUE strength, improve car transfers, and implement an HEP for pt at home.       SPENCER, PT, MEGAN A - 04/29/2018 13:21 EDT   Time Spent With Patient   PT Time In :   8:30 EST   PT Time Out :   10:00 EST   PT Therapeutic Exercise Units :   1 units   PT Therapeutic Exercise Time :   15 minutes   PT ADL TRAINING 15 MN :   4 units   Thurmon Marcus - 04/29/2018 12:23 EDT   PT ADL Training Time :   60 minutes   SPENCER, PT, MEGAN A - 04/29/2018 13:21 EDT   PT Wheelchair Management Units :   1 units   PT Wheelchair Management Time :   15 minutes   Thurmon Marcus - 04/29/2018 12:23 EDT   PT Total Individual Therapy Time :   90 minutes   SPENCER, PT, MEGAN A - 04/29/2018 13:21 EDT   PT Total Timed Code Treatment Units :   6 units   Thurmon Marcus - 04/29/2018 12:23 EDT   PT Total Timed Code Tx Minutes :   90 minutes   PT Total Treatment Time Rehab :   90 minutes   SPENCER, PT, MEGAN A - 04/29/2018 13:21 EDT

## 2018-04-29 NOTE — Progress Notes (Signed)
Functional Indep Measure Scores - Text       Functional Independence Measure Scores Entered On:  04/29/2018 13:01 EDT    Performed On:  04/29/2018 13:01 EDT by [name]               Comprehension Score   Comprehends Complex or Abstract Information Without Prompting or Cueing :   Yes   Mode of Comprehension :   Auditory   Understands Complex or Abstract Directions and Conversations :   At all times   Functional Independence Measure Comprehension :   Complete independence - 7   [name] - 04/29/2018 13:01 EDT   Expression Score   Expresses Complex or Abstract Information Without Prompting or Cueing :   Yes   Expression Mode :   Vocal   Expresses Complex or Abstract Ideas :   Clearly and fluently at all times   Functional Independence Measure Expression :   Complete independence - 7   [name] - 04/29/2018 13:01 EDT   Social Interaction Score   Interacts Appropriately Without Supervision :   Yes   Interacts Appropriately :   At all times   Functional Independence Measure Social Interaction :   Complete independence - 7   [name] - 04/29/2018 13:01 EDT   Problem Solving Score   Solves Complex Problems :   Yes   Ability to Solve Complex Problems :   Consistently solves problems independently   Functional Independence Measure Problem Solving :   Complete independence - 7   [name] - 04/29/2018 13:01 EDT   Memory Score   Memory Score Comments :   FIMs reviewed and co-signed by [name], [name]   [name], OT, [name] - 04/29/2018 15:37 EDT   Recognizes, Remembers Routines, and Executes Requests Without Prompting :   Yes   Remembers and Executes Requests :   Consistently without need for repetition   Functional Independence Measure Memory :   Complete independence - 7   [name] - 04/29/2018 13:01 EDT

## 2018-04-29 NOTE — Nursing Note (Signed)
Medication Administration Follow Up-Text       Medication Administration Follow Up Entered On:  04/29/2018 13:09 EDT    Performed On:  04/29/2018 13:12 EDT by Jimmey Ralph, RN, Megan A      Intervention Information:     ketorolac  Performed by Jimmey Ralph, RN, Megan A on 04/29/2018 12:12:00 EDT       ketorolac,10mg   Oral       Med Response   ED Medication Response :   No change in symptoms   Jimmey Ralph, RN, Megan A - 04/29/2018 13:09 EDT

## 2018-04-29 NOTE — Nursing Note (Signed)
Medication Administration Follow Up-Text       Medication Administration Follow Up Entered On:  04/29/2018 19:43 EDT    Performed On:  04/29/2018 18:25 EDT by Jimmey Ralph, RN, Megan A      Intervention Information:     ketorolac  Performed by Jimmey Ralph, RN, Megan A on 04/29/2018 17:25:00 EDT       ketorolac,10mg   Oral       Med Response   ED Medication Response :   Symptoms improved   Numeric Rating Pain Scale :   1   Pasero Opioid Induced Sedation Scale :   1 = Awake and alert   Jimmey Ralph, RN, Aundra Millet A - 04/29/2018 19:43 EDT

## 2018-04-30 NOTE — Progress Notes (Signed)
Functional Indep Measure Scores - Text       Functional Independence Measure Scores Entered On:  04/30/2018 11:43 EDT    Performed On:  04/30/2018 11:40 EDT by [place], RN, Unyimeabasi A               Eating Score   Patient's Independence Level With Eating Tasks :   Independent/Modified independence   Independent or Modifications Needed, Uses or Applies Independently :   Complete independence   Functional Independence Measure Eating :   Complete independence - 7   [place], RN, Unyimeabasi A - 04/30/2018 11:40 EDT   Toileting Score   Patient's Independence Level with Toileting Tasks :   Assistance   Type of Assistance Necessary :   Requires assistance of 2 people   Toileting :   Total assistance - 1   [place], RN, Unyimeabasi A - 04/30/2018 11:40 EDT   Bladder Management Score   Patient's Independence Level with Bladder Management Tasks :   Setup/Supervision   Supervision or Setup Needed :   Empty device, Gather equipment   Functional Independence Measure Bladder Management :   Supervision or setup - 5   [place], RN, Unyimeabasi A - 04/30/2018 11:40 EDT   Bowel Management Score   Patient's Independence Level with Bowel Management Tasks :   Assistance   Devices/Techniques Utilized :   Digital stimulation   Type of Assistance Necessary -   Digital Stimulation :   Helper performs digital stimulation only   Functional Independence Measure Bowel Management :   Total assistance - 1   [place], RN, Unyimeabasi A - 04/30/2018 11:40 EDT   Transfer Bed/Chair/WC Score   Patient's independence Level with Bed, Chair, Wheelchair Tasks :   Assistance   Type of Assistance Necessary :   Lifting two legs   Bed, Chair, Wheelchair Transfer :   Moderate assistance - 3   [place], RN, Unyimeabasi A - 04/30/2018 11:40 EDT   Transfer Toilet Score   Patient's Independence Level with Transfer Toilet Tasks :   Assistance   Amount of Assistance Necessary :   More assist than touching (patient performs 50% - 74% of tasks)   Transfer Toilet :   Moderate assistance - 3    [place], RN, Unyimeabasi A - 04/30/2018 11:40 EDT

## 2018-04-30 NOTE — Nursing Note (Signed)
Medication Administration Follow Up-Text       Medication Administration Follow Up Entered On:  04/30/2018 22:14 EDT    Performed On:  04/30/2018 22:13 EDT by Katheren Puller, RN, Masud      Intervention Information:     tramadol  Performed by Katheren Puller RN, Sheena on 04/30/2018 20:52:00 EDT       tramadol,50mg   Oral,mild pain (1-3)       Med Response   ED Medication Response :   Symptoms improved   Numeric Rating Pain Scale :   3   Pasero Opioid Induced Sedation Scale :   S = Sleep, easy to arouse   [name] [name], [name] [name] - 04/30/2018 22:13 EDT

## 2018-04-30 NOTE — Nursing Note (Signed)
Medication Administration Follow Up-Text       Medication Administration Follow Up Entered On:  04/30/2018 10:27 EDT    Performed On:  04/30/2018 10:27 EDT by Edem, RN, Unyimeabasi A      Intervention Information:     ondansetron  Performed by Edem, RN, Unyimeabasi A on 04/30/2018 08:09:00 EDT       ondansetron,4mg   Oral,nausea/vomiting       Med Response   ED Medication Response :   Symptoms improved   Pasero Opioid Induced Sedation Scale :   1 = Awake and alert   [place], RN, Unyimeabasi A - 04/30/2018 10:27 EDT

## 2018-04-30 NOTE — Nursing Note (Signed)
Medication Administration Follow Up-Text       Medication Administration Follow Up Entered On:  04/30/2018 12:11 EDT    Performed On:  04/30/2018 12:11 EDT by Edem, RN, Unyimeabasi A      Intervention Information:     ketorolac  Performed by Edem, RN, Unyimeabasi A on 04/30/2018 11:18:00 EDT       ketorolac,10mg   Oral       Med Response   ED Medication Response :   Symptoms improved   Numeric Rating Pain Scale :   3   Pasero Opioid Induced Sedation Scale :   1 = Awake and alert   [place], RN, Unyimeabasi A - 04/30/2018 12:11 EDT
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
## 2018-04-30 NOTE — Nursing Note (Signed)
Medication Administration Follow Up-Text       Medication Administration Follow Up Entered On:  04/30/2018 10:27 EDT    Performed On:  04/30/2018 10:27 EDT by Edem, RN, Unyimeabasi A      Intervention Information:     acetaminophen  Performed by Edem, RN, Unyimeabasi A on 04/30/2018 08:09:00 EDT       acetaminophen,650mg   Oral,mild pain (1-3)       Med Response   ED Medication Response :   Symptoms improved   Numeric Rating Pain Scale :   3   Pasero Opioid Induced Sedation Scale :   1 = Awake and alert   Edem, RN, Unyimeabasi A - 04/30/2018 10:27 EDT

## 2018-04-30 NOTE — Progress Notes (Signed)
Functional Indep Measure Scores - Text       Functional Independence Measure Scores Entered On:  04/30/2018 22:13 EDT    Performed On:  04/30/2018 22:13 EDT by Katheren Puller, RN, Keahi               Eating Score   Patient's Independence Level With Eating Tasks :   Independent/Modified independence   Independent or Modifications Needed, Uses or Applies Independently :   Complete independence   Functional Independence Measure Eating :   Complete independence - 7   LIGHT, FLORIAN - 04/30/2018 22:13 EDT   Toileting Score   Patient's Independence Level with Toileting Tasks :   Assistance   Type of Assistance Necessary :   Assistance with toileting tasks   Amount of Assistance Needed :   Adjusting clothing before toileting, Adjusting clothing after toileting, Cleansing perineal area   Toileting :   Total assistance - 1   SHAFT, TIA - 04/30/2018 22:13 EDT   Bladder Management Score   Patient's Independence Level with Bladder Management Tasks :   Assistance   Device/Techniques Utilized :   Catheter   Type of Assistance Necessary - Catheter :   No more help than touching (patient performs 75% or more of tasks)   Functional Independence Measure Bladder Management :   Minimal contact assistance - 4   DANIS, FILIPOVIC - 04/30/2018 22:13 EDT   Bowel Management Score   Patient's Independence Level with Bowel Management Tasks :   Assistance   Devices/Techniques Utilized :   Digital stimulation   Type of Assistance Necessary -   Digital Stimulation :   Helper changes linen or clothing/cleans up spill   Functional Independence Measure Bowel Management :   Total assistance - 1   SOMTOCHUKWU, HIRACHETA - 04/30/2018 22:13 EDT

## 2018-04-30 NOTE — Nursing Note (Signed)
Medication Administration Follow Up-Text       Medication Administration Follow Up Entered On:  04/30/2018 6:55 EDT    Performed On:  04/30/2018 6:55 EDT by Helene Kelp, RN, Kraig      Intervention Information:     ketorolac  Performed by Helene Kelp RN, Kraig on 04/30/2018 05:34:00 EDT       ketorolac,10mg   Oral       Med Response   Numeric Rating Pain Scale :   2   Raphael Gibney - 04/30/2018 6:55 EDT

## 2018-04-30 NOTE — Progress Notes (Signed)
Functional Indep Measure Scores - Text       Functional Independence Measure Scores Entered On:  04/30/2018 15:38 EDT    Performed On:  04/30/2018 15:37 EDT by [name]               Upper Body Dressing Score   Patient's Independence Level with Upper Body Dressing Tasks :   Setup/Supervision   Supervision or Setup Needed :   Gather clothes   UE Dressing :   Supervision or setup - 5   [name] - 04/30/2018 15:37 EDT   Lower Body Dressing Score   Patient's independence Level with Lower Body Dressing Tasks :   Setup/Supervision   Supervision or Setup Needed :   Gather clothes   LE Dressing :   Supervision or setup - 5   [name] - 04/30/2018 15:37 EDT   Comprehension Score   Comprehends Complex or Abstract Information Without Prompting or Cueing :   Yes   Mode of Comprehension :   Auditory   Understands Complex or Abstract Directions and Conversations :   At all times   Functional Independence Measure Comprehension :   Complete independence - 7   [name] - 04/30/2018 15:37 EDT   Expression Score   Expresses Complex or Abstract Information Without Prompting or Cueing :   Yes   Expression Mode :   Vocal   Expresses Complex or Abstract Ideas :   Clearly and fluently at all times   Functional Independence Measure Expression :   Complete independence - 7   [name] - 04/30/2018 15:37 EDT   Social Interaction Score   Interacts Appropriately Without Supervision :   Yes   Interacts Appropriately :   At all times   Functional Independence Measure Social Interaction :   Complete independence - 7   [name] - 04/30/2018 15:37 EDT   Problem Solving Score   Solves Complex Problems :   Yes   Ability to Solve Complex Problems :   Consistently solves problems independently   Functional Independence Measure Problem Solving :   Complete independence - 7   [name] - 04/30/2018 15:37 EDT   Memory Score   Memory Score Comments :   FIMs reviewed by [name], [name]   [name], OT, [name] - 04/30/2018 15:52 EDT   Recognizes, Remembers Routines, and Executes Requests Without Prompting :   Yes   Remembers and Executes Requests :   Consistently without need for repetition   Functional Independence Measure Memory :   Complete independence - 7   [name] - 04/30/2018 15:37 EDT

## 2018-04-30 NOTE — Progress Notes (Signed)
Functional Indep Measure Scores - Text       Functional Independence Measure Scores Entered On:  04/30/2018 16:21 EDT    Performed On:  04/30/2018 16:17 EDT by Rozann Lesches               Transfer Bed/Chair/WC Score   Patient's independence Level with Bed, Chair, Wheelchair Tasks :   Setup/Supervision   Supervision or Setup Needed :   Cueing, Locking wheels, Standby prompting   Bed, Chair, Wheelchair Transfer :   Supervision or setup - 5   Rozann Lesches - 04/30/2018 16:17 EDT   Walk/Wheelchair Score   Mode of Locomotion Goal :   Wheelchair   Type of Wheelchair Goal :   Manual wheelchair   Mode of Locomotion on Discharge :   Wheelchair   Patient Ambulates :   Does not occur   InM Walk Ambulation Score :   -101 ft   Does Not Occur Reason :   Patient cannot perform activity because of a medical condition or medical treatment   Functional Independence Measure Walk :   Does not occur   Assessed Locomotion in a Wheelchair :   Yes   Distance Traveled in a Wheelchair :   300 ft   Amount of Assistance Necessary :   No assistance necessary   Modified Independence Qualifiers :   Patient can independently operate a manual or motorized wheelchair for a minimum of 150 feet; turns around; maneuvers the chair to a table, bed, toilet; negotiates at least a 3 percent grade; and maneuvers on rugs and over door sills   Functional Independence Measure Wheelchair :   Modified independence - 6   Rozann Lesches - 04/30/2018 16:17 EDT   Stairs Score   Stairs Assessed :   Does not occur   Does Not Occur Reason :   Patient cannot perform activity because of a medical condition or medical treatment   Functional Independence Measure Stairs :   Does not occur   Rozann Lesches - 04/30/2018 16:17 EDT

## 2018-04-30 NOTE — Nursing Note (Signed)
Medication Administration Follow Up-Text       Medication Administration Follow Up Entered On:  04/30/2018 18:41 EDT    Performed On:  04/30/2018 18:41 EDT by Edem, RN, Unyimeabasi A      Intervention Information:     ketorolac  Performed by Edem, RN, Unyimeabasi A on 04/30/2018 17:36:00 EDT       ketorolac,10mg   Oral       Med Response   ED Medication Response :   Symptoms improved   Numeric Rating Pain Scale :   4   Pasero Opioid Induced Sedation Scale :   1 = Awake and alert   Edem, RN, Unyimeabasi A - 04/30/2018 18:41 EDT

## 2018-05-01 NOTE — Progress Notes (Signed)
Functional Indep Measure Scores - Text       Functional Independence Measure Scores Entered On:  05/01/2018 15:32 EDT    Performed On:  05/01/2018 15:32 EDT by [name]               Transfer Toilet Score   Patient's Independence Level with Transfer Toilet Tasks :   Assistance   Amount of Assistance Necessary :   More assist than touching (patient performs 50% - 74% of tasks)   Transfer Toilet :   Moderate assistance - 3   [name] - 05/01/2018 15:32 EDT   Comprehension Score   Comprehends Complex or Abstract Information Without Prompting or Cueing :   Yes   Mode of Comprehension :   Auditory   Understands Complex or Abstract Directions and Conversations :   At all times   Functional Independence Measure Comprehension :   Complete independence - 7   [name] - 05/01/2018 15:32 EDT   Expression Score   Expresses Complex or Abstract Information Without Prompting or Cueing :   Yes   Expression Mode :   Vocal   Expresses Complex or Abstract Ideas :   Clearly and fluently at all times   Functional Independence Measure Expression :   Complete independence - 7   [name] - 05/01/2018 15:32 EDT   Social Interaction Score   Interacts Appropriately Without Supervision :   Yes   Interacts Appropriately :   At all times   Functional Independence Measure Social Interaction :   Complete independence - 7   [name] - 05/01/2018 15:32 EDT   Problem Solving Score   Solves Complex Problems :   Yes   Ability to Solve Complex Problems :   Consistently solves problems independently   Functional Independence Measure Problem Solving :   Complete independence - 7   [name] - 05/01/2018 15:32 EDT   Memory Score   Memory Score Comments :   FIMs reviewed by [name], [name]   [name], OT, [name] - 05/01/2018 16:07 EDT   Recognizes, Remembers Routines, and Executes Requests Without Prompting :   Yes   Remembers and Executes Requests :   Consistently without need for repetition   Functional  Independence Measure Memory :   Complete independence - 7   [name] - 05/01/2018 15:32 EDT

## 2018-05-01 NOTE — Progress Notes (Signed)
Functional Indep Measure Scores - Text       Functional Independence Measure Scores Entered On:  05/01/2018 10:26 EDT    Performed On:  05/01/2018 10:20 EDT by Edem, RN, Unyimeabasi A               Eating Score   Patient's Independence Level With Eating Tasks :   Independent/Modified independence   Independent or Modifications Needed, Uses or Applies Independently :   Complete independence   Functional Independence Measure Eating :   Complete independence - 7   Edem, RN, Unyimeabasi A - 05/01/2018 10:20 EDT   Toileting Score   Patient's Independence Level with Toileting Tasks :   Assistance   Type of Assistance Necessary :   Assistance with toileting tasks   Amount of Assistance Needed :   Adjusting clothing before toileting, Adjusting clothing after toileting   Toileting :   Maximal assistance - 2   Edem, RN, Unyimeabasi A - 05/01/2018 10:20 EDT   Bladder Management Score   Patient's Independence Level with Bladder Management Tasks :   Setup/Supervision   Supervision or Setup Needed :   Empty device, Gather equipment   Functional Independence Measure Bladder Management :   Supervision or setup - 5   Edem, RN, Unyimeabasi A - 05/01/2018 10:20 EDT   Bowel Management Score   Patient's Independence Level with Bowel Management Tasks :   Assistance   Devices/Techniques Utilized :   Enema/Suppository   Type of Assistance Necessary - Enema/Suppository :   Assistance for more than half (patient performs 25% - 49% of tasks)   Functional Independence Measure Bowel Management :   Maximal assistance - 2   Edem, RN, Unyimeabasi A - 05/01/2018 10:20 EDT   Transfer Bed/Chair/WC Score   Patient's independence Level with Bed, Chair, Wheelchair Tasks :   Assistance   Type of Assistance Necessary :   Lifting two legs   Bed, Chair, Wheelchair Transfer :   Moderate assistance - 3   Edem, RN, Unyimeabasi A - 05/01/2018 10:20 EDT   Transfer Toilet Score   Patient's Independence Level with Transfer Toilet Tasks :   Assistance   Amount of  Assistance Necessary :   More assist than touching (patient performs 50% - 74% of tasks)   Transfer Toilet :   Moderate assistance - 3   Edem, RN, Unyimeabasi A - 05/01/2018 10:20 EDT

## 2018-05-01 NOTE — Nursing Note (Signed)
Medication Administration Follow Up-Text       Medication Administration Follow Up Entered On:  05/01/2018 22:28 EDT    Performed On:  05/01/2018 22:22 EDT by Katheren Puller, RN, Trystian      Intervention Information:     tramadol  Performed by Katheren Puller RN, Niki on 05/01/2018 20:45:00 EDT       tramadol,50mg   Oral,mild pain (1-3)       Med Response   ED Medication Response :   Symptoms improved   Numeric Rating Pain Scale :   2   Pasero Opioid Induced Sedation Scale :   S = Sleep, easy to arouse   SHOJI, PERTUIT - 05/01/2018 22:22 EDT
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
## 2018-05-01 NOTE — Nursing Note (Signed)
Medication Administration Follow Up-Text       Medication Administration Follow Up Entered On:  05/01/2018 10:04 EDT    Performed On:  05/01/2018 10:04 EDT by Edem, RN, Unyimeabasi A      Intervention Information:     tramadol  Performed by Edem, RN, Unyimeabasi A on 05/01/2018 07:54:00 EDT       tramadol,50mg   Oral,mild pain (1-3)       Med Response   ED Medication Response :   Symptoms improved   Numeric Rating Pain Scale :   3   Pasero Opioid Induced Sedation Scale :   1 = Awake and alert   Edem, RN, Unyimeabasi A - 05/01/2018 10:04 EDT

## 2018-05-01 NOTE — Nursing Note (Signed)
Medication Administration Follow Up-Text       Medication Administration Follow Up Entered On:  05/01/2018 17:54 EDT    Performed On:  05/01/2018 17:53 EDT by Edem, RN, Unyimeabasi A      Intervention Information:     ketorolac  Performed by Edem, RN, Unyimeabasi A on 05/01/2018 15:07:00 EDT       ketorolac,10mg   Oral       Med Response   ED Medication Response :   Symptoms improved   Numeric Rating Pain Scale :   3   Pasero Opioid Induced Sedation Scale :   1 = Awake and alert   Edem, RN, Unyimeabasi A - 05/01/2018 17:53 EDT

## 2018-05-01 NOTE — Progress Notes (Signed)
Functional Indep Measure Scores - Text       Functional Independence Measure Scores Entered On:  05/01/2018 12:34 EDT    Performed On:  05/01/2018 12:32 EDT by Rozann Lesches               Transfer Bed/Chair/WC Score   Patient's independence Level with Bed, Chair, Wheelchair Tasks :   Setup/Supervision   Supervision or Setup Needed :   Cueing, Standby prompting   Bed, Chair, Wheelchair Transfer :   Supervision or setup - 5   Rozann Lesches - 05/01/2018 12:32 EDT   Walk/Wheelchair Score   Mode of Locomotion Goal :   Wheelchair   Type of Wheelchair Goal :   Manual wheelchair   Mode of Locomotion on Discharge :   Wheelchair   Assessed Locomotion in a Wheelchair :   Yes   Distance Traveled in a Wheelchair :   150 ft   Amount of Assistance Necessary :   No assistance necessary   Modified Independence Qualifiers :   Patient can independently operate a manual or motorized wheelchair for a minimum of 150 feet; turns around; maneuvers the chair to a table, bed, toilet; negotiates at least a 3 percent grade; and maneuvers on rugs and over door sills   Functional Independence Measure Wheelchair :   Modified independence - 6   Rozann Lesches - 05/01/2018 12:32 EDT

## 2018-05-01 NOTE — Progress Notes (Signed)
 PT Inpatient Daily Documentation - Text       PT Inpatient Daily Documentation Entered On:  05/01/2018 12:32 EDT    Performed On:  05/01/2018 12:12 EDT by Thurmon Marcus               Reason for Treatment   Subjective Statement :   Pt agreeable to PT. No reports of p! in the last 24 hrs. Minimal low back spasms reported last night, but pt stated that they improved once he iced them.     *Reason for Referral :   TSCI T7 AIS A on R/ T11 AIS A on the L with zpp through L3.   Pt suffered T11-12 burst fx, multiple rib fx's, T4-7 vertebral body fx's, R SI joint widening req ORIF, L sacral fx, L ulnar styloid fx, R ulnar fx      Precautions: TLSO OOB, NWB R LE WBAT LLE/UE, WBAT R UE ; TLSO brace on in prone or during activities in prone  15HRS/week     *Chief Complaint :   Pt remains c c/o R sided LBP t/o transitions & while seated.      Thurmon Marcus - 05/01/2018 12:12 EDT   Review/Treatments Provided   PT Wheelchair Management :   Yes   Thurmon Marcus - 05/01/2018 12:35 EDT   PT Goals :   PT Short Term Goals    04/30/2018  Transfer Goal #3: Transfer board; Mod I; Transfer board; Progressing, continue  Wheelchair Management Goal #3: Manual wheelchair propulsion in community; Close S; Curbs; Progressing, continue  Balance Goal #3: Unsupported short sit; Mod I; Dynamic sitting balance; Improve independence with activities of daily living; Progressing, continue; Pt able to pick item up off floor.           PT Plan :   Treatment Frequency:  Daily (modified)   Performed By: Mendoza, PT, [name]  04/12/2018 16:22  Treatment Duration: 3 Performed By: Mendoza, PT, [name]  04/12/2018 16:22  Planned Treatments: Balance training, Bed mobility training, Caregiver training, Equipment training, Functional training, Manual therapy, Moist heat/ice, Neuromuscular reeducation, Pain management, Patient education, Therapeutic activities, Therapeutic exercises, Transfer... Performed By: Mendoza, PT, [name]  04/12/2018 16:22     SPENCER, PT,  MEGAN A - 05/02/2018 16:44 EDT         Short Term Goals Reviewed :   Chaney Thurmon Marcus - 05/01/2018 12:12 EDT   Physical Therapy Orders :   Physical Therapy Medical Hold - 04/21/18 15:49:00 EDT, Stop date 04/21/18 15:49:00 EDT  Physical Therapy Medical Hold - 04/16/18 11:13:00 EDT, Stop date 04/16/18 11:13:00 EDT, incontinent bowel and bladder due to bowel program not completed.  Physical Therapy Inpatient Additional Treatment Rehab - 04/12/18 16:52:55 EDT, Balance training, Bed mobility training, Caregiver training, Equipment training, Functional training, Manual therapy, Moist heat/ice, Neuromuscular reeducation, Pain management, Patient education, Therapeutic activities, Therape...  PT FIMS - 04/11/18 12:54:00 EDT, Daily     SPENCER, PT, MEGAN A - 05/02/2018 16:44 EDT         Pain Present :   No actual or suspected pain   PT Therapeutic Activity,Mobility,Balance :   Yes   PT Therapeutic Exercise :   Yes   Thurmon Marcus - 05/01/2018 12:12 EDT   Therapeutic Activities/Mobility/Balance   Functional Activity :   Therapeutic Activities  Activity 1:  Bed mobility; Mod I; Pt performed bed mobility from supine <> prone <> short sitting <> long sitting  mod I w/o VC's.       Performed Date:  04/30/2018  Activity 2:  Transfer training; Close S, Minimal verbal cues; Sliding board; Pt performed slide board lateral transfers 5x each direction WC <> mat table. Close S c VC's for foot placement and fwd lean in appropriate direction. Pt was able to perform setup independently.       Performed Date:  04/30/2018  Activity 3:  Balance; Supported short sit; Pt performed unsupported reaching in short sitting at EOB. 20 reps x 3 sets each UE. Emphasis on using BUE, scapulae, and head to maintain balance. 6 LOB during act, but pt able to self correct each time.       Performed Date:  04/30/2018  Activity 4:  Transfer training; Close S; Pt performed level slide board transfers from [facility] <-> plinth and hospital bed to [facility] c close S, VC's for set  up, and min VC's for sequencing of mvmt. (modified)       Performed Date:  04/29/2018  Activity 5:  Other: WC negotiation; Worked on negotiation of curbs (using red mat), increasing height of curb as pt was successful up to ~3.  fwd and bckwd negotiation of high grade and low grade ramps, and uneven surfaces. Pt req close S for ramps and uneven surfaces. Min-modA for curbs. (modified)       Performed Date:  04/29/2018     JACQUES, PT, MEGAN A - 05/02/2018 16:44 EDT   SPENCER, PT, MEGAN A - 05/02/2018 16:44 EDT         PT Therapeutic Activities Grid     Activity 1  Activity 2  Activity 3      Activity :    Bed mobility   Transfer training   Transfer training        Assist :    Modified independence   Close supervision   Close supervision        Position :    Prone, Sidelying, Supine, Supported short sit   Supported short sit   Supported short sit        Equipment :       Sliding board           Comment :    Pt performed all bed mobility mod I. Pt asked PT questions about best strategies for performing c curve vs. log roll for sit <> supine tsfr. Advised pt that c-curve is the best option.   Pt performed WC <> mat table/bed tsfr c close S for safety. Min VC's for foot and board placement. Pt performed 6 total tsfrs (3 R, 3 L)   Pt performed press ups c hand blocks to improve rear clearance for progression towards tsfrs w/o SB. Emphasis on fwd lean and scapular depression. 10 reps. Session length limited 2/2 BM          Thurmon Marcus - 05/01/2018 12:12 EDT  Thurmon Marcus - 05/01/2018 12:40 EDT  Thurmon Marcus - 05/01/2018 12:12 EDT       Reassess Mobility :   Yes   Thurmon Marcus - 05/01/2018 12:12 EDT   PT Mobility   Mobility Grid   Roll Left :   Rehab Modified independence   Roll Right :   Rehab Modified independence   Roll Prone :   Rehab Modified independence   Roll Supine :   Rehab Modified independence   Supine to Sit :   Rehab Modified independence   Sit to  Supine :   Rehab Modified independence   Scooting :   Close  supervision   Sit to Stand :   Does not occur   Stand to Sit :   Does not occur   Transfer Bed to and From Chair :   Close supervision   Transfer Toilet :   Does not occur   Tub/Shower Transfer :   Does not occur   Floor Recovery :   Does not occur   Thurmon Marcus - 05/01/2018 12:12 EDT   Functional Mobility Details :   ..   Thurmon Marcus - 05/01/2018 12:12 EDT   Amb Ability Varied Surf/Distraction Grid   Level Surfaces :   Does not occur   Uneven Surfaces :   Does not occur   Distracting Environments :   Does not occur   Curbs :   Does not occur   Stairs :   Does not occur   Ramp :   Does not occur   Thurmon Marcus - 05/01/2018 12:12 EDT   Transfer Type :   Transfer board   PT Mobility Reviewed :   Yes   Thurmon Marcus - 05/01/2018 12:12 EDT   Therapeutic Exercise   Therapeutic Exercise RTF :   Therapeutic Exercise  Exercise 1:  Upper extremity strengthening; Prone; 10x2; gravity; Pt performed prone I's, T's, Y's in center of mat table c emphasis on focusing on scapular mvmt. Pt reported he felt pec stretch with these activities.       Performed Date:  04/30/2018  Exercise 2:  Scapular strengthening; Prone on elbows; 10 x 3; mod manual; Pt performed prone serratus pushups on elbows c resistance provided by PT to lateral scapular borders.       Performed Date:  04/30/2018  Exercise 3:  Lower extremity passive range; 1 min x 3 each LE; gait belt; pt performed self-assisted calf stretches in long sitting c gait belt wrapped around ball of foot. No c/o pain in LE or pelvis.       Performed Date:  04/30/2018  Exercise 4:  Lower extremity passive range; 1 min x 3; Pt performed hamstring stretch in long sitting c emphasis on maintaining neutral spine and rotating at pelvis to isolate hamstrings.       Performed Date:  04/30/2018     JACQUES, PT, MEGAN A - 05/02/2018 16:44 EDT   SPENCER, PT, MEGAN A - 05/02/2018 16:44 EDT         Therapeutic Exercise Grid     Exercise 3  Exercise 1  Exercise 2      Exercise :    Lower extremity  stretching   Upper extremity strengthening   Scapular strengthening        Position :    Sidelying   Prone on elbows   Prone        Repetition/Time :    1 min x 2 sets each LE   15 reps x 3 sets   10 x 2 sets        Resist or Assist :       mod manual resist           Comment :    PT facilitated hip flx stretch c each LE in sidelying. Pt reported this alleviated his tightness in his lower abdomen bilaterally.   Pt performed 15 reps x 3 sets prone serratus press ups on elbows c mod manual assist at lateral scap border.  Pt tolerates well. Edu sister on proper hand placement for performing these at home.   Pt performed prone I's, T's, Y's on mat table c min VC's for BUE placement and progression of mvmt.          Thurmon Marcus - 05/01/2018 12:35 EDT  Thurmon Marcus - 05/01/2018 12:12 EDT  Thurmon Marcus - 05/01/2018 12:12 EDT       WC Management   Type of Wheelchair :   Manual wheelchair   Wheelchair Details :   K5 manual WC   Thurmon Marcus - 05/01/2018 12:35 EDT   Wheelchair Mobility   Level Surfaces :   Rehab Modified independence   Thurmon Marcus - 05/01/2018 12:35 EDT   Wheelchair Mobility Level Distance Daily :   150    Thurmon Marcus - 05/01/2018 12:35 EDT   Short Term Goals   Bed Mobility Goal Grid     Goal #1  Goal #2  Goal #3  Goal #4    Descriptors :    Supine to sit   Sit to supine   Roll to right and left   Bed mobility     Level :    Moderate assistance   Moderate assistance   Close supervision   Modified independence     Status :    Goal met   Goal met   Goal met   Goal met     Date Met :    04/23/2018 EDT   04/23/2018 EDT   04/23/2018 EDT   04/29/2018 EDT       Thurmon Marcus - 05/01/2018 12:12 EDT  Thurmon Marcus - 05/01/2018 12:12 EDT  Thurmon Marcus - 05/01/2018 12:12 EDT  Thurmon Marcus - 05/01/2018 12:12 EDT      Transfers Goal Grid     Goal #1  Goal #2  Goal #3      Descriptors :    Press photographer board        Level :    Maximal assistance   Setup   Modified independence        Device :    Armed forces operational officer board        Status :    Goal met   Goal met   Progressing, continue        Date Met :    04/23/2018 EDT   04/28/2018 EDT             Thurmon Marcus - 05/01/2018 12:12 EDT  Thurmon Marcus - 05/01/2018 12:12 EDT  Thurmon Marcus - 05/01/2018 12:12 EDT       W/C Management Grid     Goal #1  Goal #2  Goal #3      Descriptors :    Mobility with manual wheelchair   Pressure relief forward lean   Manual wheelchair propulsion in community        Level :    Modified independence   Distant supervision   Close supervision        Distance :    300 ft              Details :          Curbs        Status :    Goal met   Goal met   Progressing, continue  Thurmon Marcus - 05/01/2018 12:12 EDT  Thurmon Marcus - 05/01/2018 12:12 EDT  Thurmon Marcus - 05/01/2018 12:12 EDT       PT Balance Goal Grid     Goal #1  Goal #2  Goal #3      Descriptor :    Supported short sit   Unsupported short sit   Unsupported short sit        Assist Level :    Modified independence   Close supervision   Modified independence        Type :    Static sitting   Static sitting   Dynamic sitting balance        Length of Time (minutes) :    5 minutes   5 minutes           Rationale :          Improve independence with activities of daily living        Status :    Goal met   Goal met   Progressing, continue        Comments :          Pt able to pick item up off floor.          Thurmon Marcus - 05/01/2018 12:12 EDT  Thurmon Marcus - 05/01/2018 12:12 EDT  Thurmon Marcus - 05/01/2018 12:12 EDT       PT ST Goals Reviewed :   Chaney Thurmon Marcus - 05/01/2018 12:12 EDT   Education   Responsible Learner Present for Session :   Yes   Home Caregiver Name/Relationship :   sister, Ginny Thurmon Marcus - 05/01/2018 12:12 EDT   Physical Therapy Education Grid   Bed Mobility :   Verbalizes understanding, Needs practice/supervision   Bed to Chair Transfers :   Verbalizes understanding, Needs practice/supervision   Body Mechanics :   Verbalizes understanding, Needs  practice/supervision   Exercise Program :   [employer] understanding, Needs practice/supervision   Thurmon Marcus - 05/01/2018 12:12 EDT   Special Tests   PT Special Tests Grid     Test #1          Special Tests :    SCIM (mobility section)              Test Results :    7/40                Thurmon Marcus - 05/01/2018 12:40 EDT         Assessment   PT Impairments or Limitations :   Balance deficits, Bed mobility deficits, Endurance deficits, Pain limiting function, Safety awareness deficits, Strength deficits, Transfer deficits, Transition deficits, Wheelchair mobility deficits   Barriers to Safe Discharge PT :   Severity of deficits   Discharge Recommendations :   ELOS 3 weeks if pt able to WB through RUE soon. Otherwise PT recommending d/c home 1 week after fam training completed to use hoyer and loaner w/c, then pt return to rehab once able to WB through RUE. Pt highly motivated. Pt very supportive    SCIM (mobility)=7/40    DME: [employer] custom w/c      Thurmon Marcus - 05/01/2018 12:12 EDT   PT Treatment Recommendations :   Pt participated well in PT today, however, progression of transfers and sessions was limited 2 times 2/2 BM (mid-session), and UI (last 10 min of session.  However, pt is responding well to HEP exercises as he only req min VC's for progression of mvmt or UE placement. Pt is doing well with safely problem solving his way through bed mobility and tsfrs, only occasionally asking PT for input or verbal guidance. He continues to demonstrate mod I c bed mobility, and is ready to begin progression of WC <> mat table lateral transfer w/o transfer board. Pt still req skilled inpatient rehab physical therapy services to improve transfers, WC negotiation of ramps and curbs, BUE strength, BLE ROM, and HEP.       Thurmon Marcus - 05/01/2018 12:43 EDT   Time Spent With Patient   PT Time In :   8:30 EST   PT Time Out :   10:00 EST   PT Therapeutic Exercise Units :   2 units   PT Therapeutic Exercise Time :   30  minutes   PT ADL TRAINING 15 MN :   4 units   PT ADL Training Time :   60 minutes   PT Total Individual Therapy Time :   90 minutes   PT Total Timed Code Treatment Units :   6 units   PT Total Timed Code Tx Minutes :   90 minutes   PT Total Treatment Time Rehab :   90 minutes   Thurmon Marcus - 05/01/2018 12:12 EDT

## 2018-05-01 NOTE — Nursing Note (Signed)
Medication Administration Follow Up-Text       Medication Administration Follow Up Entered On:  05/01/2018 6:49 EDT    Performed On:  05/01/2018 6:49 EDT by Katheren Puller RN, Thaddeus      Intervention Information:     ketorolac  Performed by Salvatore Decent on 05/01/2018 06:08:00 EDT       ketorolac,10mg   Oral       Med Response   ED Medication Response :   Symptoms improved   Numeric Rating Pain Scale :   3   Pasero Opioid Induced Sedation Scale :   1 = Awake and alert   THEODIS, KINSEL - 05/01/2018 6:49 EDT

## 2018-05-01 NOTE — Progress Notes (Signed)
Functional Indep Measure Scores - Text       Functional Independence Measure Scores Entered On:  05/01/2018 22:27 EDT    Performed On:  05/01/2018 22:22 EDT by Katheren Puller, RN, Machael               Eating Score   Patient's Independence Level With Eating Tasks :   Independent/Modified independence   Independent or Modifications Needed, Uses or Applies Independently :   Complete independence   Functional Independence Measure Eating :   Complete independence - 7   DASHELL, DELEONE - 05/01/2018 22:22 EDT   Toileting Score   Patient's Independence Level with Toileting Tasks :   Assistance   Type of Assistance Necessary :   Assistance with toileting tasks   Amount of Assistance Needed :   Adjusting clothing before toileting, Adjusting clothing after toileting, Cleansing perineal area   Toileting :   Total assistance - 1   ARNALDO, MELANCON - 05/01/2018 22:22 EDT   Bladder Management Score   Patient's Independence Level with Bladder Management Tasks :   Setup/Supervision   Supervision or Setup Needed :   Gather equipment   Functional Independence Measure Bladder Management :   Supervision or setup - 5   NAKARI, DELEO - 05/01/2018 22:22 EDT   Bowel Management Score   Patient's Independence Level with Bowel Management Tasks :   Assistance   Devices/Techniques Utilized :   Digital stimulation   Type of Assistance Necessary -   Digital Stimulation :   Helper changes linen or clothing/cleans up spill   Functional Independence Measure Bowel Management :   Total assistance - 1   JAVYN, POLLACK - 05/01/2018 22:22 EDT

## 2018-05-01 NOTE — Nursing Note (Signed)
Medication Administration Follow Up-Text       Medication Administration Follow Up Entered On:  05/01/2018 14:16 EDT    Performed On:  05/01/2018 14:16 EDT by Edem, RN, Unyimeabasi A      Intervention Information:     ketorolac  Performed by Edem, RN, Unyimeabasi A on 05/01/2018 12:52:00 EDT       ketorolac,10mg   Oral       Med Response   ED Medication Response :   Symptoms improved   Numeric Rating Pain Scale :   4   Pasero Opioid Induced Sedation Scale :   1 = Awake and alert   Edem, RN, Unyimeabasi A - 05/01/2018 14:16 EDT

## 2018-05-01 NOTE — Progress Notes (Signed)
 Therapeutic Recreation Progress - Text       Therapeutic Recreation Progress Note Entered On:  05/01/2018 10:24 EDT    Performed On:  05/01/2018 10:11 EDT by GEORJEAN SUZEN RAMAN               Progress Note   Leisure Skill Activity #4     Leisure Skill Activity #1  Leisure Skill Activity #2        Therapeutic Focus :    Activity modification, Adaptive equipment training   Activity endurance, Sitting balance, Sitting tolerance           Level of Assistance :    Modified independence   Modified independence           Sessions Needed :    2 TX session   3 TX session           Goal Status :    Met   Ongoing             AQUINO,  KIMBERLY S - 05/01/2018 10:11 EDT  GEORJEAN SUZEN RAMAN - 05/01/2018 10:11 EDT        Community Reintegration Grid     Community Reintegration Activity #1  Community Reintegration Activity #2        Therapeutic Focus :    Resources   Barriers, Problem solving, Wheelchair mobility           Level of Assistance :    Complete independence   Modified independence           Sessions Needed :    2 TX session   2 TX session           Goal Status :    Ongoing   Ongoing           Comment   (Comment: outing to grocery store, [place],  KIMBERLY S - 05/01/2018 10:11 EDT] )        AQUINO,  KIMBERLY S - 05/01/2018 10:11 EDT  GEORJEAN SUZEN RAMAN - 05/01/2018 10:11 EDT        Social Interaction Grid     Social Interaction Activity #1          Therapeutic Focus :    Social support, Other: adjustment              Level of Assistance :    Complete independence              Sessions Needed :    2 TX session              Goal Status :    Met                GEORJEAN SUZEN RAMAN - 05/01/2018 10:11 EDT         Leisure Participation Grid     Initiation Leisure Activity #1          Therapeutic Focus :    Activity pattern development at home              Level of Assistance :    Modified independence              Sessions Needed :    1 TX session                GEORJEAN SUZEN RAMAN - 05/01/2018 10:11 EDT         Summary   Actual  Deficits :  Adjustment, Leisure awareness, LEU use, Sitting balance   Perceived Deficits :   Coping   Planned Treatments :   Adaptive skills, Adjustment, Barriers, Community reentry, Other: resources   Planned Frequency :   2-3 times per week   Planned Duration :   3-4 weeks   Overall Progress :   Pt attended outing to grocery store and [employer] c TR services. Pt used wc to propel self throughout outing- needing S for ULS 2* wc instability. S c curbcut and doorways/ thresholds. Pt using handheld grocery cart to obtain items. Reaching up/down within limits of TLSO to access shelves- S level. Pt accessed cold cases c mod I, increased time to px solve technique. Pt lifting items at check out within in his lift/ reach restrictions. Pt then taken to [facility] for frozen yogurt- goals of accessing different venue/ barriers. Needing min A for doorway 2* slope of entry and wt of door- CTRS assisted. Pt mod I c accessing levers and bowl to fill cup, items from topping bar within his reach. Pt managing wc and open container s issue. Upon return to facility- pt put items in refrigerator at mod I to access shelves and drawers. Pt needing cues during outing to be aware of his limits, endurance and ask assistance.  Pt seemed determined to complete tasks independently. Pt responsive to outing- reporting I appreciated the challenge and learning new things I will have to do at home. I feel like I did well.  Continue c goal of meal prep- mod I c adaptive techniques  in kitchen.      GEORJEAN IHA S - 05/01/2018 10:11 EDT   TR Charges   TR Time In :   15:00 EST   TR Time Out :   17:00 EST   TR Individualized Outing Units :   8 units   TR Chart Total Treatment Time Units :   8 units   TR Daily Total Treatment Time Units :   8 units   AQUINO,  KIMBERLY S - 05/01/2018 10:11 EDT

## 2018-05-02 NOTE — Nursing Note (Signed)
Medication Administration Follow Up-Text       Medication Administration Follow Up Entered On:  05/02/2018 16:24 EDT    Performed On:  05/02/2018 16:24 EDT by Nickolas Madrid, RN, Azim      Intervention Information:     ketorolac  Performed by Nickolas Madrid, RN, Azim on 05/02/2018 15:36:00 EDT       ketorolac,10mg   Oral       Med Response   ED Medication Response :   No adverse reaction, Symptoms improved   Pasero Opioid Induced Sedation Scale :   1 = Awake and alert   Hossain, RN, Azim - 05/02/2018 16:24 EDT

## 2018-05-02 NOTE — Progress Notes (Signed)
Functional Indep Measure Scores - Text       Functional Independence Measure Scores Entered On:  05/02/2018 15:43 EDT    Performed On:  05/02/2018 15:43 EDT by [name]               Comprehension Score   Comprehends Complex or Abstract Information Without Prompting or Cueing :   Yes   Mode of Comprehension :   Auditory   Understands Complex or Abstract Directions and Conversations :   At all times   Functional Independence Measure Comprehension :   Complete independence - 7   [name] - 05/02/2018 15:43 EDT   Expression Score   Expresses Complex or Abstract Information Without Prompting or Cueing :   Yes   Expression Mode :   Vocal   Expresses Complex or Abstract Ideas :   Clearly and fluently at all times   Functional Independence Measure Expression :   Complete independence - 7   [name] - 05/02/2018 15:43 EDT   Social Interaction Score   Interacts Appropriately Without Supervision :   Yes   Interacts Appropriately :   At all times   Functional Independence Measure Social Interaction :   Complete independence - 7   [name] - 05/02/2018 15:43 EDT   Problem Solving Score   Solves Complex Problems :   Yes   Ability to Solve Complex Problems :   Consistently solves problems independently   Functional Independence Measure Problem Solving :   Complete independence - 7   [name] - 05/02/2018 15:43 EDT   Memory Score   Memory Score Comments :   FIMs reviewed by [name], [name]   [name], OT, [name] - 05/02/2018 16:01 EDT   Recognizes, Remembers Routines, and Executes Requests Without Prompting :   Yes   Remembers and Executes Requests :   Consistently without need for repetition   Functional Independence Measure Memory :   Complete independence - 7   [name] - 05/02/2018 15:43 EDT

## 2018-05-02 NOTE — Progress Notes (Signed)
 PT Inpatient Daily Documentation - Text       PT Inpatient Daily Documentation Entered On:  05/02/2018 13:08 EDT    Performed On:  05/02/2018 11:15 EDT by LORELLA, PT, AMBER R               Reason for Treatment   *Reason for Referral :   [employer] T7 AIS A on R/ T11 AIS A on the L with zpp through L3.   Pt suffered T11-12 burst fx, multiple rib fx's, T4-7 vertebral body fx's, R SI joint widening req ORIF, L sacral fx, L ulnar styloid fx, R ulnar fx      Precautions: TLSO OOB, NWB R LE WBAT LLE/UE, WBAT R UE ; TLSO brace on in prone or during activities in prone  15HRS/week     *Chief Complaint :   Pt remains c c/o R sided LBP t/o transitions & while seated.      LORELLA, PT, AMBER R - 05/02/2018 12:58 EDT   Review/Treatments Provided   PT Goals :   PT Short Term Goals    05/01/2018  Transfer Goal #3: Transfer board; Mod I; Transfer board; Progressing, continue  Wheelchair Management Goal #3: Manual wheelchair propulsion in community; Close S; Curbs; Progressing, continue  Balance Goal #3: Unsupported short sit; Mod I; Dynamic sitting balance; Improve independence with activities of daily living; Progressing, continue; Pt able to pick item up off floor.     PT Plan :   Treatment Frequency:  Daily (modified)   Performed By: Mendoza, PT, [name]  04/12/2018 16:22  Treatment Duration: 3 Performed By: Mendoza, PT, [name]  04/12/2018 16:22  Planned Treatments: Balance training, Bed mobility training, Caregiver training, Equipment training, Functional training, Manual therapy, Moist heat/ice, Neuromuscular reeducation, Pain management, Patient education, Therapeutic activities, Therapeutic exercises, Transfer... Performed By: Mendoza, PT, [name]  04/12/2018 16:22     Short Term Goals Reviewed :   Yes   Physical Therapy Orders :   Physical Therapy Medical Hold - 04/21/18 15:49:00 EDT, Stop date 04/21/18 15:49:00 EDT  Physical Therapy Medical Hold - 04/16/18 11:13:00 EDT, Stop date 04/16/18 11:13:00 EDT, incontinent bowel and  bladder due to bowel program not completed.  Physical Therapy Inpatient Additional Treatment Rehab - 04/12/18 16:52:55 EDT, Balance training, Bed mobility training, Caregiver training, Equipment training, Functional training, Manual therapy, Moist heat/ice, Neuromuscular reeducation, Pain management, Patient education, Therapeutic activities, Therape...  PT FIMS - 04/11/18 12:54:00 EDT, Daily     Pain Present :   Yes actual or suspected pain   PT Therapeutic Activity,Mobility,Balance :   Yes   PT Wheelchair Management :   Yes   BENTON, PT, AMBER R - 05/02/2018 12:58 EDT   Pain Assessment   Pain Location :   Elbow   Laterality :   Right   Self Report Pain :   Numeric rating scale   BENTON, PT, AMBER R - 05/02/2018 12:58 EDT   Therapeutic Activities/Mobility/Balance   Functional Activity :   Therapeutic Activities  Activity 1:  Bed mobility; Mod I; Prone, Sidelying, Supine, Supported short sit; Pt performed all bed mobility mod I. Pt asked PT questions about best strategies for performing c curve vs. log roll for sit <> supine tsfr. Advised pt that c-curve is the best option.       Performed Date:  05/01/2018  Activity 2:  Transfer training; Close S; Supported short sit; Sliding board; Pt performed WC <> mat table/bed tsfr c close S for safety.  Min VC's for foot and board placement. Pt performed 6 total tsfrs (3 R, 3 L)       Performed Date:  05/01/2018  Activity 3:  Transfer training; Close S; Supported short sit; Pt performed press ups c hand blocks to improve rear clearance for progression towards tsfrs w/o SB. Emphasis on fwd lean and scapular depression. 10 reps. Session length limited 2/2 BM       Performed Date:  05/01/2018  Activity 4:  Transfer training; Close S; Pt performed level slide board transfers from [facility] <-> plinth and hospital bed to [facility] c close S, VC's for set up, and min VC's for sequencing of mvmt. (modified)       Performed Date:  04/29/2018  Activity 5:  Other: WC negotiation; Worked on negotiation  of curbs (using red mat), increasing height of curb as pt was successful up to ~3.  fwd and bckwd negotiation of high grade and low grade ramps, and uneven surfaces. Pt req close S for ramps and uneven surfaces. Min-modA for curbs. (modified)       Performed Date:  04/29/2018     LORELLA, PT, AMBER R - 05/02/2018 12:58 EDT   PT Therapeutic Activities Grid     Activity 1  Activity 2        Activity :    Transfer training              Assist :    Minimal assistance, Moderate assistance              Response :    Demonstrated positive response to treatment, Improved stability in muscle group(s), Improved timing, control, and coordination, Increased activation of targeted muscle group(s)              Comment :    lateral pressover transfer with mod A progressing to min A following training for increased ant WS. VC for hand placement, decr momentum and increase use of head/hip relationship. Transfer component practice ant WS with hip clearance on mat to (cont)   (cont) fatique to improve motor learning and technique. Initially external feedback, progressing to internal feedback. Carryover into lateral pressover transfer with min A and manual cues for incr ant WS.             LORELLA, PT, AMBER R - 05/02/2018 12:58 EDT  LORELLA, PT, AMBER R - 05/02/2018 12:58 EDT        Reassess Mobility :   Yes   BENTON, PT, AMBER R - 05/02/2018 12:58 EDT   PT Mobility   Mobility Grid   Transfer Bed to and From Chair :   Rehab Minimal assistance   BENTON, PT, AMBER R - 05/02/2018 12:58 EDT   Functional Mobility Details :   ..   BENTON, PT, AMBER R - 05/02/2018 12:58 EDT   Amb Ability Varied Surf/Distraction Grid   Level Surfaces :   Does not occur   Uneven Surfaces :   Does not occur   Distracting Environments :   Does not occur   Curbs :   Does not occur   Stairs :   Does not occur   Ramp :   Does not occur   BENTON, PT, AMBER R - 05/02/2018 12:58 EDT   Transfer Type :   Lateral transfer   PT Mobility Reviewed :   Yes   BENTON, PT, AMBER R -  05/02/2018 12:58 EDT   WC Management   Type of Wheelchair :  Manual wheelchair   Wheelchair Details :   ramp training with VC for ant WS to decr tippiness of WC. Min A for higher incline ramps due to decr trunk control and TLSO   BENTON, PT, AMBER R - 05/02/2018 12:58 EDT   Wheelchair Mobility   Level Surfaces :   Rehab Modified independence   Ramps :   Rehab Minimal assistance   BENTON, PT, AMBER R - 05/02/2018 12:58 EDT   Short Term Goals   Bed Mobility Goal Grid     Goal #1  Goal #2  Goal #3  Goal #4    Descriptors :    Supine to sit   Sit to supine   Roll to right and left   Bed mobility     Level :    Moderate assistance   Moderate assistance   Close supervision   Modified independence     Status :    Goal met   Goal met   Goal met   Goal met     Date Met :    04/23/2018 EDT   04/23/2018 EDT   04/23/2018 EDT   04/29/2018 EDT       LORELLA, PT, AMBER R - 05/02/2018 12:58 EDT  BENTON, PT, AMBER R - 05/02/2018 12:58 EDT  BENTON, PT, AMBER R - 05/02/2018 12:58 EDT  BENTON, PT, AMBER R - 05/02/2018 12:58 EDT      Transfers Goal Grid     Goal #1  Goal #2  Goal #3  Goal #4    Descriptors :    Therapist, art   Lateral transfer     Level :    Maximal assistance   Setup   Modified independence   Minimal assistance     Device :    Press photographer board        Status :    Goal met   Goal met   Progressing, continue   Initial     Date Met :    04/23/2018 EDT   04/28/2018 EDT             BENTON, PT, AMBER R - 05/02/2018 12:58 EDT  BENTON, PT, AMBER R - 05/02/2018 12:58 EDT  BENTON, PT, AMBER R - 05/02/2018 12:58 EDT  BENTON, PT, AMBER R - 05/02/2018 12:58 EDT      W/C Management Grid     Goal #1  Goal #2  Goal #3      Descriptors :    Mobility with manual wheelchair   Pressure relief forward lean   Manual wheelchair propulsion in community        Level :    Modified independence   Distant supervision   Close supervision        Distance :    300 ft              Details :           Curbs        Status :    Goal met   Goal met   Progressing, continue          BENTON, PT, AMBER R - 05/02/2018 12:58 EDT  LORELLA, PT, AMBER R - 05/02/2018 12:58 EDT  BENTON, PT, AMBER R - 05/02/2018 12:58 EDT       PT Balance Goal Grid     Goal #1  Goal #2  Goal #3      Descriptor :    Supported short sit   Unsupported short sit   Unsupported short sit        Assist Level :    Modified independence   Close supervision   Modified independence        Type :    Static sitting   Static sitting   Dynamic sitting balance        Length of Time (minutes) :    5 minutes   5 minutes           Rationale :          Improve independence with activities of daily living        Status :    Goal met   Goal met   Progressing, continue        Comments :          Pt able to pick item up off floor.          LORELLA, PT, AMBER R - 05/02/2018 12:58 EDT  LORELLA, PT, AMBER R - 05/02/2018 12:58 EDT  LORELLA, PT, AMBER R - 05/02/2018 12:58 EDT       PT ST Goals Reviewed :   Chaney LORELLA, PT, AMBER R - 05/02/2018 12:58 EDT   Education   Home Caregiver Name/Relationship :   sister, Ginny LORELLA, PT, AMBER R - 05/02/2018 13:08 EDT   Physical Therapy Education Grid   Bed to Chair Transfers :   [employer] understanding, Paediatric nurse, Needs further teaching, Needs practice/supervision   Spinal Cord Specific Education :   [employer] understanding, Demonstrates, Needs further teaching, Needs practice/supervision   BENTON, PT, AMBER R - 05/02/2018 13:08 EDT   PT Additional Education :   Pt edu on preservation of UE function, limiting lateral pressover transfers when pain present in R UE to necessary transfers only, utlizing same technqiue on SB to decrease strain on R UE while healing.      BENTON, PT, AMBER R - 05/02/2018 13:08 EDT   Assessment   PT Impairments or Limitations :   Balance deficits, Bed mobility deficits, Endurance deficits, Pain limiting function, Safety awareness deficits, Strength deficits, Transfer deficits, Transition deficits, Wheelchair  mobility deficits   Barriers to Safe Discharge PT :   Severity of deficits   Discharge Recommendations :   ELOS 3 weeks if pt able to WB through RUE soon. Otherwise PT recommending d/c home 1 week after fam training completed to use hoyer and loaner w/c, then pt return to rehab once able to WB through RUE. Pt highly motivated. Pt very supportive    SCIM (mobility)=7/40    DME: [employer] w/c      PT Treatment Recommendations :   Pt demonstrates improved technique with lateral pressover transfers following training. Evidence of motor learning present as able to decrease manual cues and move from extrinsic to intrinsic feedback. He was limited by pain, decr ROM R elbow and decr muscle power in UE and trunk needed for functional transfers, but technique improved. He will cont to benefit from skilled PT to address above impairments and progress indep with functional mobility and transfers prior to DC.      BENTON, PT, AMBER R - 05/02/2018 12:58 EDT   Time Spent With Patient   PT Time In :   11:15 EST   PT Time Out :   12:30 EST   PT ADL TRAINING 15 MN :  4 units   PT ADL Training Time :   60 minutes   PT Wheelchair Management Units :   1 units   PT Wheelchair Management Time :   15 minutes   PT Total Individual Therapy Time :   75 minutes   PT Total Timed Code Treatment Units :   5 units   PT Total Timed Code Tx Minutes :   75 minutes   PT Total Treatment Time Rehab :   75 minutes   BENTON, PT, AMBER R - 05/02/2018 12:58 EDT   PT Units Cancelled Missed     PT Units Lost #1          Amount :    1               Reason :    Scheduling error                BENTON, PT, AMBER R - 05/02/2018 12:58 EDT         PT Minutes Grid     PT Minutes Lost #1          Amount :    15               Reason :    Scheduling error                BENTON, PT, AMBER R - 05/02/2018 12:58 EDT

## 2018-05-02 NOTE — Progress Notes (Signed)
Therapeutic Recreation Progress - Text       Therapeutic Recreation Progress Note Entered On:  05/02/2018 19:31 EDT    Performed On:  05/02/2018 19:26 EDT by [name] [name]               Progress Note   Leisure Skill Activity #4     Leisure Skill Activity #1  Leisure Skill Activity #2        Therapeutic Focus :    Activity modification, Adaptive equipment training   Activity endurance, Sitting balance, Sitting tolerance           Level of Assistance :    Modified independence   Modified independence           Sessions Needed :    2 TX session   3 TX session           Goal Status :    Met   Ongoing             [name] [name] - 05/02/2018 19:26 EDT  [name] [name] - 05/02/2018 19:26 EDT        Community Reintegration Grid     Community Reintegration Activity #1  Community Reintegration Activity #2        Therapeutic Focus :    Resources   Barriers, Problem solving, Wheelchair mobility           Level of Assistance :    Complete independence   Modified independence           Sessions Needed :    2 TX session   2 TX session           Goal Status :    Ongoing   Ongoing             [name] [name] - 05/02/2018 19:26 EDT  [name] [name] - 05/02/2018 19:26 EDT        Social Interaction Grid     Social Interaction Activity #1          Therapeutic Focus :    Social support, Other: adjustment              Level of Assistance :    Complete independence              Sessions Needed :    2 TX session              Goal Status :    [name] [name] - 05/02/2018 19:26 EDT         Leisure Participation Grid     Initiation Leisure Activity #1          Therapeutic Focus :    Activity pattern development at home              Level of Assistance :    Modified independence              Sessions Needed :    1 TX session                [name] [name] - 05/02/2018 19:26 EDT         Summary   Actual Deficits :   Adjustment, Leisure awareness, LEU use, Sitting balance   Perceived Deficits :   Coping    Planned Treatments :  Adaptive skills, Adjustment, Barriers, Community reentry, Other: resources   Planned Frequency :   2-3 times per week   Planned Duration :   3-4 weeks   Overall Progress :   pt participated in kitchen tasks today, preparing meal. Pt using [employer] and oven c S and setup. Cues for energy conservation and adaptive skills. Pt demonstrating fatigue after prep and cooking 50% of meal. CTRS assisted c remainder and clean up, so pt has time to eat. Discussed timing of tasks for home, prepping ahead of time, asking for A, planning as important change in self role. Pt reporting understanding. Discussed other community situations and barriers possible in future. Pt demonstrating good px solving and planning. Pt left kitchen on own to cath prior to next session.      [name] [name] S - 05/02/2018 19:26 EDT   TR Charges   TR Time In :   12:30 EST   TR Time Out :   14:00 EST   TR Individualized Units :   6 units   TR Chart Total Treatment Time Units :   6 units   TR Daily Total Treatment Time Units :   6 units   [name] [name] - 05/02/2018 19:26 EDT

## 2018-05-02 NOTE — Nursing Note (Signed)
Medication Administration Follow Up-Text       Medication Administration Follow Up Entered On:  05/02/2018 10:18 EDT    Performed On:  05/02/2018 10:18 EDT by Nickolas Madrid, RN, Azim      Intervention Information:     ketorolac  Performed by Salvatore Decent on 05/02/2018 06:02:00 EDT       ketorolac,10mg   Oral       Med Response   ED Medication Response :   No adverse reaction, Symptoms improved   Numeric Rating Pain Scale :   0 = No pain   Pasero Opioid Induced Sedation Scale :   1 = Awake and alert   Hossain, RN, Azim - 05/02/2018 10:18 EDT

## 2018-05-02 NOTE — Progress Notes (Signed)
Functional Indep Measure Scores - Text       Functional Independence Measure Scores Entered On:  05/02/2018 12:04 EDT    Performed On:  05/02/2018 12:03 EDT by Nickolas Madrid, RN, Azim               Eating Score   Patient's Independence Level With Eating Tasks :   Independent/Modified independence   Independent or Modifications Needed, Uses or Applies Independently :   Complete independence   Functional Independence Measure Eating :   Complete independence - 7   Hossain, RN, Azim - 05/02/2018 12:03 EDT   Toileting Score   Patient's Independence Level with Toileting Tasks :   Assistance   Type of Assistance Necessary :   Assistance with toileting tasks   Amount of Assistance Needed :   Adjusting clothing before toileting, Adjusting clothing after toileting, Cleansing perineal area   Toileting :   Total assistance - 1   Hossain, RN, Azim - 05/02/2018 12:03 EDT   Bladder Management Score   Patient's Independence Level with Bladder Management Tasks :   Setup/Supervision   Supervision or Setup Needed :   Gather equipment   Functional Independence Measure Bladder Management :   Supervision or setup - 5   Hossain, RN, Azim - 05/02/2018 12:03 EDT   Bowel Management Score   Patient's Independence Level with Bowel Management Tasks :   Assistance   Devices/Techniques Utilized :   Digital stimulation   Type of Assistance Necessary -   Digital Stimulation :   Helper changes linen or clothing/cleans up spill   Functional Independence Measure Bowel Management :   Total assistance - 1   Hossain, RN, Azim - 05/02/2018 12:03 EDT   Transfer Bed/Chair/WC Score   Patient's independence Level with Bed, Chair, Wheelchair Tasks :   Assistance   Type of Assistance Necessary :   More assist than touching (patient performs 50% - 74% of tasks)   Bed, Chair, Wheelchair Transfer :   Moderate assistance - 3   Hossain, RN, Azim - 05/02/2018 12:03 EDT
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
## 2018-05-02 NOTE — Nursing Note (Signed)
Medication Administration Follow Up-Text       Medication Administration Follow Up Entered On:  05/02/2018 12:32 EDT    Performed On:  05/02/2018 12:32 EDT by Nickolas Madrid, RN, Azim      Intervention Information:     ketorolac  Performed by Nickolas Madrid, RN, Azim on 05/02/2018 11:47:00 EDT       ketorolac,10mg   Oral       Med Response   ED Medication Response :   No adverse reaction, Symptoms improved   Numeric Rating Pain Scale :   0 = No pain   Pasero Opioid Induced Sedation Scale :   1 = Awake and alert   Hossain, RN, Azim - 05/02/2018 12:32 EDT

## 2018-05-03 NOTE — Nursing Note (Signed)
Medication Administration Follow Up-Text       Medication Administration Follow Up Entered On:  05/03/2018 7:23 EDT    Performed On:  05/03/2018 7:08 EDT by Chinita Greenland      Intervention Information:     ketorolac  Performed by Chinita Greenland on 05/03/2018 06:08:00 EDT       ketorolac,10mg   Oral       Med Response   ED Medication Response :   No adverse reaction, Symptoms improved   Chinita Greenland - 05/03/2018 7:23 EDT

## 2018-05-03 NOTE — Nursing Note (Signed)
Medication Administration Follow Up-Text       Medication Administration Follow Up Entered On:  05/03/2018 11:25 EDT    Performed On:  05/03/2018 11:25 EDT by Ottie Glazier, RN, Amy J      Intervention Information:     ketorolac  Performed by Ottie Glazier, RN, Amy J on 05/03/2018 10:57:00 EDT       ketorolac,10mg   Oral       Med Response   ED Medication Response :   No adverse reaction   Ottie Glazier, RN, Amy J - 05/03/2018 11:25 EDT
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
## 2018-05-03 NOTE — Progress Notes (Signed)
Functional Indep Measure Scores - Text       Functional Independence Measure Scores Entered On:  05/03/2018 10:18 EDT    Performed On:  05/03/2018 10:17 EDT by Ottie Glazier, RN, Amy J               Grooming Score   Patient's Independence Level with Grooming Tasks :   Independent/Modified independence   Independent or Modifications Needed, Uses or Applies Independently :   Complete independence   Grooming :   Complete independence - 7   Varnum, RN, Amy J - 05/03/2018 10:17 EDT   Bladder Management Score   Patient's Independence Level with Bladder Management Tasks :   Setup/Supervision   Supervision or Setup Needed :   Empty device, Gather equipment   Functional Independence Measure Bladder Management :   Supervision or setup - 5   Varnum, RN, Amy J - 05/03/2018 10:17 EDT   Bowel Management Score   Patient's Independence Level with Bowel Management Tasks :   Assistance   Devices/Techniques Utilized :   Enema/Suppository   Type of Assistance Necessary - Enema/Suppository :   Total assist (patient performs less than 25%)   Functional Independence Measure Bowel Management :   Total assistance - 1   Varnum, RN, Salomon Fick - 05/03/2018 10:17 EDT   Transfer Bed/Chair/WC Score   Patient's independence Level with Bed, Chair, Wheelchair Tasks :   Assistance   Type of Assistance Necessary :   Incidental help for contact guard or steadying   Bed, Chair, Wheelchair Transfer :   Minimal contact assistance - 4   Varnum, RN, Amy J - 05/03/2018 10:17 EDT

## 2018-05-03 NOTE — Progress Notes (Signed)
Functional Indep Measure Scores - Text       Functional Independence Measure Scores Entered On:  05/03/2018 1:53 EDT    Performed On:  05/03/2018 1:49 EDT by Chinita Greenland               Eating Score   Patient's Independence Level With Eating Tasks :   Setup/Supervision   Supervision or Setup Needed :   Cut food, Open containers, Pour liquid into containers, Prepare food for eating   Functional Independence Measure Eating :   Supervision or setup - 5   Chinita Greenland - 05/03/2018 1:49 EDT   Toileting Score   Patient's Independence Level with Toileting Tasks :   Assistance   Type of Assistance Necessary :   Requires assistance of 2 people   Toileting :   Total assistance - 1   Chinita Greenland - 05/03/2018 1:49 EDT   Bladder Management Score   Patient's Independence Level with Bladder Management Tasks :   Independent/Modified independence   Independent or Modifications Needed, Uses or Applies Independently :   Intermittent catheterization, manages independently   Functional Independence Measure Bladder Management :   Modified independence - 6   Chinita Greenland - 05/03/2018 1:49 EDT   Bowel Management Score   Patient's Independence Level with Bowel Management Tasks :   Assistance   Devices/Techniques Utilized :   Digital stimulation   Type of Assistance Necessary -   Digital Stimulation :   Helper changes linen or clothing/cleans up spill   Functional Independence Measure Bowel Management :   Total assistance - 1   Chinita Greenland - 05/03/2018 1:49 EDT   Transfer Bed/Chair/WC Score   Patient's independence Level with Bed, Chair, Wheelchair Tasks :   Assistance   Type of Assistance Necessary :   Assistance for more than half (patient performs 25% - 49% of tasks)   Bed, Chair, Wheelchair Transfer :   Maximal assistance - 2   Chinita Greenland - 05/03/2018 1:49 EDT   Transfer Toilet Score   Patient's Independence Level with Transfer Toilet Tasks :   Assistance   Amount of Assistance Necessary :   Assistance for more than half  (patient performs 25% - 49% of tasks)   Transfer Toilet :   Maximal assistance - 2   Chinita Greenland - 05/03/2018 1:49 EDT

## 2018-05-03 NOTE — Progress Notes (Signed)
OT Time Spent With Patient - Text       OT Time Spent With Patient Entered On:  05/03/2018 15:51 EDT    Performed On:  05/03/2018 15:49 EDT by Eliseo Gum, OT, ANN K               Time Spent With Patient   OT Therapeutic Exercise Units :   0 units   OT Therapeutic Exercise Time :   0 minutes   OT Total Individual Therapy Time Rehab :   0    OT Total Timed Code Treatment Units :   0 units   OT Total Timed Code Treatment Minutes Rehab :   0    OT Total Treatment Time Rehab :   0 minutes   BENTON, OT, ANN K - 05/03/2018 15:49 EDT   OT Units Cancelled Missed     OT Units Lost #1          Amount :    2               Reason :    Other: Patient with bowel issues - getting treatment to have bowel movement. Check on patient x 2.                 BENTON, OT, ANN K - 05/03/2018 15:49 EDT         OT Minutes Cancelled Missed Grid     OT Minutes Lost #1          Amount :    30               Reason :    Other: Patient with bowel issues - getting treatment to have bowel movement. Check on patient x 2.                 BENTON, OT, ANN K - 05/03/2018 15:49 EDT

## 2018-05-03 NOTE — Nursing Note (Signed)
Medication Administration Follow Up-Text       Medication Administration Follow Up Entered On:  05/03/2018 16:19 EDT    Performed On:  05/03/2018 16:19 EDT by Ottie Glazier, RN, Amy J      Intervention Information:     ketorolac  Performed by Ottie Glazier, RN, Amy J on 05/03/2018 15:57:00 EDT       ketorolac,10mg   Oral       Med Response   ED Medication Response :   No adverse reaction   Ottie Glazier, RN, Amy J - 05/03/2018 16:19 EDT

## 2018-05-03 NOTE — Nursing Note (Signed)
Medication Administration Follow Up-Text       Medication Administration Follow Up Entered On:  05/03/2018 7:23 EDT    Performed On:  05/03/2018 7:23 EDT by Ottie Glazier, RN, Amy J      Intervention Information:     ketorolac  Performed by Chinita Greenland on 05/03/2018 06:08:00 EDT       ketorolac,10mg   Oral       Med Response   ED Medication Response :   No adverse reaction   Ottie Glazier, RN, Amy J - 05/03/2018 7:23 EDT

## 2018-05-04 NOTE — Progress Notes (Signed)
Functional Indep Measure Scores - Text       Functional Independence Measure Scores Entered On:  05/04/2018 10:03 EDT    Performed On:  05/04/2018 10:02 EDT by Ottie Glazier, RN, Amy J               Eating Score   Patient's Independence Level With Eating Tasks :   Independent/Modified independence   Independent or Modifications Needed, Uses or Applies Independently :   Complete independence   Functional Independence Measure Eating :   Complete independence - 7   Varnum, RN, Amy J - 05/04/2018 10:02 EDT   Bladder Management Score   Patient's Independence Level with Bladder Management Tasks :   Setup/Supervision   Supervision or Setup Needed :   Empty device, Gather equipment   Functional Independence Measure Bladder Management :   Supervision or setup - 5   Varnum, RN, Amy J - 05/04/2018 10:02 EDT   Bowel Management Score   Patient's Independence Level with Bowel Management Tasks :   Assistance   Devices/Techniques Utilized :   Enema/Suppository   Type of Assistance Necessary - Enema/Suppository :   Helper changes linen or clothing/cleans up spill   Functional Independence Measure Bowel Management :   Total assistance - 1   Varnum, RN, Amy J - 05/04/2018 10:02 EDT   Transfer Bed/Chair/WC Score   Patient's independence Level with Bed, Chair, Wheelchair Tasks :   Assistance   Type of Assistance Necessary :   More assist than touching (patient performs 50% - 74% of tasks)   Bed, Chair, Wheelchair Transfer :   Moderate assistance - 3   Varnum, RN, Salomon Fick - 05/04/2018 10:02 EDT

## 2018-05-04 NOTE — Nursing Note (Signed)
Medication Administration Follow Up-Text       Medication Administration Follow Up Entered On:  05/04/2018 8:06 EDT    Performed On:  05/04/2018 8:06 EDT by Ottie Glazier, RN, Amy J      Intervention Information:     ketorolac  Performed by Debroah Loop, RN, KAREN M on 05/04/2018 07:23:00 EDT       ketorolac,10mg   Oral       Med Response   ED Medication Response :   No adverse reaction   Ottie Glazier, RN, Amy J - 05/04/2018 8:06 EDT

## 2018-05-04 NOTE — Nursing Note (Signed)
Medication Administration Follow Up-Text       Medication Administration Follow Up Entered On:  05/04/2018 11:52 EDT    Performed On:  05/04/2018 11:52 EDT by Ottie Glazier, RN, Amy J      Intervention Information:     ketorolac  Performed by Ottie Glazier, RN, Amy J on 05/04/2018 11:04:00 EDT       ketorolac,10mg   Oral       Med Response   ED Medication Response :   No adverse reaction   Ottie Glazier, RN, Amy J - 05/04/2018 11:52 EDT
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
## 2018-05-05 NOTE — Progress Notes (Signed)
Functional Indep Measure Scores - Text       Functional Independence Measure Scores Entered On:  05/05/2018 22:39 EDT    Performed On:  05/05/2018 22:37 EDT by Jena Gauss, LPN, APRIL T               Eating Score   Patient's Independence Level With Eating Tasks :   Independent/Modified independence   Independent or Modifications Needed, Uses or Applies Independently :   Complete independence   Functional Independence Measure Eating :   Complete independence - 7   MAXWELL, LPN, APRIL T - 03/18/8118 22:37 EDT   Toileting Score   Patient's Independence Level with Toileting Tasks :   Assistance   Type of Assistance Necessary :   Requires assistance of 2 people   Toileting :   Total assistance - 1   MAXWELL, LPN, APRIL T - 1/47/8295 22:37 EDT   Bladder Management Score   Patient's Independence Level with Bladder Management Tasks :   Independent/Modified independence   Independent or Modifications Needed, Uses or Applies Independently :   Intermittent catheterization, manages independently   Functional Independence Measure Bladder Management :   Modified independence - 6   MAXWELL, LPN, APRIL T - 05/30/3085 22:37 EDT   Bowel Management Score   Patient's Independence Level with Bowel Management Tasks :   Assistance   Devices/Techniques Utilized :   Enema/Suppository   Type of Assistance Necessary - Enema/Suppository :   Total assist (patient performs less than 25%)   Functional Independence Measure Bowel Management :   Total assistance - 1   MAXWELL, LPN, APRIL T - 5/78/4696 22:37 EDT   Transfer Bed/Chair/WC Score   Patient's independence Level with Bed, Chair, Wheelchair Tasks :   Independent/Modified independence   Independent or Modifications Needed, Uses or Applies Independently :   Transfer board   Bed, Chair, Wheelchair Transfer :   Modified independence - 6   MAXWELL, LPN, APRIL T - 2/95/2841 22:37 EDT

## 2018-05-05 NOTE — Progress Notes (Signed)
IRF-PAI Quality Indicators Adm -Text       IRF-PAI Quality Indicators Admission Entered On:  05/05/2018 10:22 EDT    Performed On:  04/11/2018 10:16 EDT by Kyung Rudd, RN, MANUVETTE E               Section B: Hearing Speech and Vision   BB0700 Expression of Ideas and Wants :   Expresses complex messages without difficulty and with speech that is clear and easy to understand - 4   BB0800 Understanding Verbal Content :   Understands - Clear comprehension without cues or repetitions - 4   KENNEDY, RN, MANUVETTE E - 05/05/2018 10:16 EDT   Section H: Bladder and Bowel   H0350 Bladder Continence :   Always continent   H0400 Bowel Continence :   Occasionally incontinent   KENNEDY, RN, MANUVETTE E - 05/05/2018 10:16 EDT   Section J: Admission Health Conditions   J1750 2 or More Fall,Fall Injury Last Yr :   No   J2000 Major Surgery 100 Days Pre Admit :   Yes   VARNADOE, RN, CATHERINE - 05/07/2018 10:56 EDT

## 2018-05-05 NOTE — Progress Notes (Signed)
Functional Indep Measure Scores - Text       Functional Independence Measure Scores Entered On:  05/05/2018 9:21 EDT    Performed On:  05/05/2018 9:19 EDT by Ottie Glazier, RN, Amy J               Eating Score   Patient's Independence Level With Eating Tasks :   Independent/Modified independence   Independent or Modifications Needed, Uses or Applies Independently :   Complete independence   Functional Independence Measure Eating :   Complete independence - 7   Varnum, RN, Amy J - 05/05/2018 9:19 EDT   Bladder Management Score   Patient's Independence Level with Bladder Management Tasks :   Setup/Supervision   Supervision or Setup Needed :   Empty device, Gather equipment   Functional Independence Measure Bladder Management :   Supervision or setup - 5   Varnum, RN, Amy J - 05/05/2018 9:19 EDT   Bowel Management Score   Patient's Independence Level with Bowel Management Tasks :   Assistance   Devices/Techniques Utilized :   Enema/Suppository   Type of Assistance Necessary - Enema/Suppository :   Helper changes linen or clothing/cleans up spill   Functional Independence Measure Bowel Management :   Total assistance - 1   Varnum, RN, Amy J - 05/05/2018 9:19 EDT   Transfer Bed/Chair/WC Score   Patient's independence Level with Bed, Chair, Wheelchair Tasks :   Assistance   Type of Assistance Necessary :   No more help than touching (patient performs 75% or more of tasks)   Bed, Chair, Wheelchair Transfer :   Minimal contact assistance - 4   Varnum, RN, Amy J - 05/05/2018 9:19 EDT

## 2018-05-05 NOTE — Progress Notes (Signed)
Functional Indep Measure Scores - Text       Functional Independence Measure Scores Entered On:  05/05/2018 15:58 EDT    Performed On:  05/05/2018 9:45 EDT by Rozann Lesches               Transfer Bed/Chair/WC Score   Patient's independence Level with Bed, Chair, Wheelchair Tasks :   Independent/Modified independence   Independent or Modifications Needed, Uses or Applies Independently :   Transfer board   Bed, Chair, Wheelchair Transfer :   Modified independence - 6   Warren, PT, AMBER R - 05/05/2018 16:59 EDT   Walk/Wheelchair Score   Mode of Locomotion Goal :   Wheelchair   Type of Wheelchair Goal :   Manual wheelchair   Mode of Locomotion on Discharge :   Wheelchair   Patient Ambulates :   Does not occur   InM Walk Ambulation Score :   -101 ft   Does Not Occur Reason :   Patient cannot perform activity because of a medical condition or medical treatment   Functional Independence Measure Walk :   Does not occur   Assessed Locomotion in a Wheelchair :   Yes   Distance Traveled in a Wheelchair :   300 ft   Amount of Assistance Necessary :   No assistance necessary   Modified Independence Qualifiers :   Patient can independently operate a manual or motorized wheelchair for a minimum of 150 feet; turns around; maneuvers the chair to a table, bed, toilet; negotiates at least a 3 percent grade; and maneuvers on rugs and over door sills   Functional Independence Measure Wheelchair :   Modified independence - 6   BENTON, PT, AMBER R - 05/05/2018 16:59 EDT   Stairs Score   Stairs Assessed :   Does not occur   Does Not Occur Reason :   Patient cannot perform activity because of a medical condition or medical treatment   Functional Independence Measure Stairs :   Does not occur   BENTON, PT, AMBER R - 05/05/2018 16:59 EDT

## 2018-05-05 NOTE — Progress Notes (Signed)
Functional Indep Measure Scores - Text       Functional Independence Measure Scores Entered On:  05/05/2018 0:26 EDT    Performed On:  05/05/2018 0:25 EDT by Azucena Kuba, RN, VANESSA               Bladder Management Score   Patient's Independence Level with Bladder Management Tasks :   Setup/Supervision   Supervision or Setup Needed :   Empty device, Setup equipment   Functional Independence Measure Bladder Management :   Supervision or setup - 5   Azucena Kuba, RN, VANESSA - 05/05/2018 0:25 EDT   Bowel Management Score   Patient's Independence Level with Bowel Management Tasks :   Assistance   Devices/Techniques Utilized :   Enema/Suppository   Type of Assistance Necessary - Enema/Suppository :   Total assist (patient performs less than 25%)   Functional Independence Measure Bowel Management :   Total assistance - 1   REID, RN, VANESSA - 05/05/2018 0:25 EDT

## 2018-05-05 NOTE — Case Communication (Signed)
DME Face to Face Documentation - Text       DME Face to Face Documentation Entered On:  05/05/2018 10:51 EDT    Performed On:  05/05/2018 10:51 EDT by Jameriah Trotti, MD, Dashawn Golda J               DME Face to Face Encounter   Encounter Date :   05/05/2018 10:51 EDT   DME Certification Statement :   I had a face to face encounter with this patient that meets the provider face to face encounter requirements., The encounter was related in whole or in part for the following medical conditions, which is the primary reason for Durable Medical Equipment, Based upon my findings during evaluation of this patient, this equipment as specified on attached is medically necessary for the patient in the home., I have spoken to the patient about the need for this equipment and the required use at home within the past 30 days., As the ordering provider, I attest to the evaluation, assessment, treatment and follow-up in relation to both the patient and to the home health services ordered.   DME Bedside Commode :   The patient is confined to a single room   Hiliana Eilts, MD, Ravina Milner J - 05/05/2018 10:51 EDT
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
## 2018-05-05 NOTE — Case Communication (Signed)
DME Face to Face Documentation - Text       DME Face to Face Documentation Entered On:  05/05/2018 10:51 EDT    Performed On:  05/05/2018 10:51 EDT by Lowell Guitar, MD, Elmon Else               DME Face to Face Encounter   Encounter Date :   05/05/2018 10:51 EDT   DME Certification Statement :   I had a face to face encounter with this patient that meets the provider face to face encounter requirements., The encounter was related in whole or in part for the following medical conditions, which is the primary reason for Durable Medical Equipment, Based upon my findings during evaluation of this patient, this equipment as specified on attached is medically necessary for the patient in the home., I have spoken to the patient about the need for this equipment and the required use at home within the past 30 days., As the ordering provider, I attest to the evaluation, assessment, treatment and follow-up in relation to both the patient and to the home health services ordered.   DME Bedside Commode :   The patient is confined to a single room   Lowell Guitar, MD, Elmon Else - 05/05/2018 10:51 EDT

## 2018-05-05 NOTE — Progress Notes (Signed)
Nursing Admission Indep Measure - Text       Nursing Admission Indep Measure Scores Entered On:  05/05/2018 10:16 EDT    Performed On:  05/05/2018 10:16 EDT by Kyung Rudd, RN, MANUVETTE E               Admission Scores   *Number of Bowel Accidents Past Four Days Prior to Admission :   0    *Number of Bladder Accidents Past Four Days Prior to Admission :   0    KENNEDY, RN, MANUVETTE E - 05/05/2018 10:16 EDT   Activities of Daily Living Grid   Eating :   Does not occur   Toileting :   Does not occur   Bed, Chair, Wheelchair Transfer :   Does not occur   Toilet Transfer :   Does not occur   Tub Transfer :   Does not occur   KENNEDY, RN, MANUVETTE E - 05/05/2018 10:16 EDT   Goals   Bladder Level of Assistance  Goal :   Modified independence - 6   Bowel Level of Assistance Goal :   Modified independence - 6   KENNEDY, RN, MANUVETTE E - 05/05/2018 10:16 EDT

## 2018-05-05 NOTE — Progress Notes (Signed)
PT Inpatient Discharge Exam -Text       PT Inpatient Discharge Exam Entered On:  05/05/2018 12:30 EDT    Performed On:  05/05/2018 9:45 EDT by Rozann Lesches               Reason for Treatment   Subjective Statement :   Pt reports that he his excited to be heading home tomorrow. Pt's family and girlfriend are present and have car that pt with D/C home in.      *Reason for Referral :   TSCI T7 AIS A on R/ T11 AIS A on the L with zpp through L3.   Pt suffered T11-12 burst fx, multiple rib fx's, T4-7 vertebral body fx's, R SI joint widening req ORIF, L sacral fx, L ulnar styloid fx, R ulnar fx      Precautions: TLSO OOB, NWB R LE WBAT LLE/UE, WBAT R UE ; TLSO brace on in prone or during activities in prone  15HRS/week     Rozann Lesches - 05/05/2018 12:04 EDT   General Information   Physical Therapy Orders :   Physical Therapy Medical Hold - 04/21/18 15:49:00 EDT, Stop date 04/21/18 15:49:00 EDT  Physical Therapy Medical Hold - 04/16/18 11:13:00 EDT, Stop date 04/16/18 11:13:00 EDT, incontinent bowel and bladder due to bowel program not completed.  Physical Therapy Inpatient Additional Treatment Rehab - 04/12/18 16:52:55 EDT, Balance training, Bed mobility training, Caregiver training, Equipment training, Functional training, Manual therapy, Moist heat/ice, Neuromuscular reeducation, Pain management, Patient education, Therapeutic activities, Therape...  PT FIMS - 04/11/18 12:54:00 EDT, Daily     Precautions RTF :   Precaution Orders   No qualifying data available.     BENTON, PT, AMBER R - 05/05/2018 16:41 EDT   Orientation Assessment :   Oriented x 4   Affect/Behavior :   Appropriate, Calm, Cooperative   Pain Present :   Yes actual or suspected pain   Culture/Spiritual Beliefs to Incorporate :   No   Rozann Lesches - 05/05/2018 12:04 EDT   Problem List   (As Of: 05/05/2018 12:30:28 EDT)   Problems(Active)    Adrenal hemorrhage (SNOMED CT  :21308657 )  Name of Problem:   Adrenal hemorrhage ; Recorder:   [name];  Confirmation:   Confirmed ; Classification:   Patient Stated ; Code:   [id number] ; Contributor System:   PowerChart ; Last Updated:   04/09/2018 12:30 EDT ; Life Cycle Date:   04/09/2018 ; Life Cycle Status:   Active ; Vocabulary:   SNOMED CT        Bursitis (SNOMED CT  :284132440 )  Name of Problem:   Bursitis ; Recorder:   [name]; Confirmation:   Confirmed ; Classification:   Patient Stated ; Code:   [id number] ; Contributor System:   PowerChart ; Last Updated:   04/09/2018 12:31 EDT ; Life Cycle Date:   04/09/2018 ; Life Cycle Status:   Active ; Vocabulary:   SNOMED CT        Closed fracture of symphysis pubis with diastasis (SNOMED CT  :440347425 )  Name of Problem:   Closed fracture of symphysis pubis with diastasis ; Recorder:   [name]; Confirmation:   Confirmed ; Classification:   Patient Stated ; Code:   [id number] ; Contributor System:   Dietitian ; Last Updated:   04/09/2018 12:30 EDT ; Life Cycle Date:   04/09/2018 ; Life Cycle Status:  Active ; Vocabulary:   SNOMED CT        Impaired mobility (SNOMED CT  :161096045 )  Name of Problem:   Impaired mobility ; Onset Date:   04/11/2018 ; Recorder:   SYSTEM,  SYSTEM; Confirmation:   Confirmed ; Classification:   Interdisciplinary ; Code:   [id number] ; Last Updated:   04/11/2018 16:13 EDT ; Life Cycle Date:   04/11/2018 ; Life Cycle Status:   Active ; Vocabulary:   SNOMED CT   ; Comments:        04/11/2018 16:13 - SYSTEM,  SYSTEM  Problem added by Discern Expert      Left ulnar fracture (SNOMED CT  :78295621 )  Name of Problem:   Left ulnar fracture ; Recorder:   [name]; Confirmation:   Confirmed ; Classification:   Patient Stated ; Code:   30865784 ; Contributor System:   Dietitian ; Last Updated:   04/09/2018 12:31 EDT ; Life Cycle Date:   04/09/2018 ; Life Cycle Status:   Active ; Vocabulary:   SNOMED CT        Lower back pain (SNOMED CT  :696295284 )  Name of Problem:   Lower back pain ; Recorder:   [name]; Confirmation:    Confirmed ; Classification:   Patient Stated ; Code:   132440102 ; Contributor System:   PowerChart ; Last Updated:   04/09/2018 12:29 EDT ; Life Cycle Date:   04/09/2018 ; Life Cycle Status:   Active ; Vocabulary:   SNOMED CT        Lower extremity paralysis (SNOMED CT  :725366440 )  Name of Problem:   Lower extremity paralysis ; Recorder:   [name]; Confirmation:   Confirmed ; Classification:   Patient Stated ; Code:   347425956 ; Contributor System:   PowerChart ; Last Updated:   04/09/2018 12:29 EDT ; Life Cycle Date:   04/09/2018 ; Life Cycle Status:   Active ; Vocabulary:   SNOMED CT        Lung laceration (SNOMED CT  :387564332 )  Name of Problem:   Lung laceration ; Recorder:   [name]; Confirmation:   Confirmed ; Classification:   Patient Stated ; Code:   951884166 ; Contributor System:   Dietitian ; Last Updated:   04/09/2018 12:32 EDT ; Life Cycle Date:   04/09/2018 ; Life Cycle Status:   Active ; Vocabulary:   SNOMED CT        Moderate protein-calorie malnutrition (SNOMED CT  :[id number] )  Name of Problem:   Moderate protein-calorie malnutrition ; Recorder:   Fulton Mole; Confirmation:   Confirmed ; Classification:   Medical ; Code:   [id number] ; Contributor System:   PowerChart ; Last Updated:   04/18/2018 11:58 EDT ; Life Cycle Date:   04/18/2018 ; Life Cycle Status:   Active ; Responsible Provider:   Lowell Guitar, MD, Elmon Else; Vocabulary:   SNOMED CT   ; Comments:        04/18/2018 11:58 - Fulton Mole  Moderate malnutrition related to unintentional weight loss as evidenced by 11% weight loss <3 months, moderate muscle loss of calf region, patellar region, varied PO intakes, decrease in functional ability/ADLs given paraplegia after [facility] accident.      Paraplegia (SNOMED CT  :831517616 )  Name of Problem:   Paraplegia ; Recorder:   Mignon Pine H; Confirmation:   Confirmed ;  Classification:   Patient Stated ; Code:   782956213 ;  Contributor System:   Dietitian ; Last Updated:   04/09/2018 12:31 EDT ; Life Cycle Date:   04/09/2018 ; Life Cycle Status:   Active ; Vocabulary:   SNOMED CT        Rhabdomyolysis (SNOMED CT  :086578469 )  Name of Problem:   Rhabdomyolysis ; Recorder:   [name]; Confirmation:   Confirmed ; Classification:   Patient Stated ; Code:   629528413 ; Contributor System:   Dietitian ; Last Updated:   04/09/2018 12:32 EDT ; Life Cycle Date:   04/09/2018 ; Life Cycle Status:   Active ; Vocabulary:   SNOMED CT        Ribs, multiple fractures (SNOMED CT  :2440102 )  Name of Problem:   Ribs, multiple fractures ; Recorder:   [name]; Confirmation:   Confirmed ; Classification:   Patient Stated ; Code:   7253664 ; Contributor System:   Dietitian ; Last Updated:   04/09/2018 12:31 EDT ; Life Cycle Date:   04/09/2018 ; Life Cycle Status:   Active ; Vocabulary:   SNOMED CT        Spinal cord injury at T7-T12 level (SNOMED CT  :4034742595 )  Name of Problem:   Spinal cord injury at T7-T12 level ; Recorder:   [name]; Confirmation:   Confirmed ; Classification:   Patient Stated ; Code:   6387564332 ; Contributor System:   Dietitian ; Last Updated:   04/09/2018 12:29 EDT ; Life Cycle Date:   04/09/2018 ; Life Cycle Status:   Active ; Vocabulary:   SNOMED CT        Sprain of sacroiliac joint (SNOMED CT  :951884166 )  Name of Problem:   Sprain of sacroiliac joint ; Recorder:   [name]; Confirmation:   Confirmed ; Classification:   Patient Stated ; Code:   063016010 ; Contributor System:   Dietitian ; Last Updated:   04/09/2018 12:30 EDT ; Life Cycle Date:   04/09/2018 ; Life Cycle Status:   Active ; Vocabulary:   SNOMED CT        Stable burst fracture of T11 vertebra (SNOMED CT  :932355732 )  Name of Problem:   Stable burst fracture of T11 vertebra ; Recorder:   [name]; Confirmation:   Confirmed ; Classification:   Patient Stated ; Code:   202542706 ; Contributor System:   PowerChart ;  Last Updated:   04/09/2018 12:30 EDT ; Life Cycle Date:   04/09/2018 ; Life Cycle Status:   Active ; Vocabulary:   SNOMED CT          Diagnoses(Active)    Cognitive communication deficit  Date:   04/12/2018 ; Diagnosis Type:   Reason For Visit ; Confirmation:   Differential ; Clinical Dx:   Cognitive communication deficit ; Classification:   Interdisciplinary ; Clinical Service:   Non-Specified ; Code:   ICD-10-CM ; Probability:   0 ; Diagnosis Code:   R41.841      Multiple trauma  Date:   04/12/2018 ; Diagnosis Type:   Discharge ; Confirmation:   Confirmed ; Clinical Dx:   Multiple trauma ; Classification:   Medical ; Code:   ICD-10-CM ; Probability:   0 ; Diagnosis Code:   T07.XXXA      Neurogenic bladder  Date:   04/12/2018 ; Diagnosis Type:   Discharge ;  Confirmation:   Confirmed ; Clinical Dx:   Neurogenic bladder ; Classification:   Medical ; Code:   ICD-10-CM ; Probability:   0 ; Diagnosis Code:   N31.9      Neurogenic bowel  Date:   04/12/2018 ; Diagnosis Type:   Discharge ; Confirmation:   Confirmed ; Clinical Dx:   Neurogenic bowel ; Classification:   Medical ; Code:   ICD-10-CM ; Probability:   0 ; Diagnosis Code:   K59.2      Paraplegia  Date:   04/12/2018 ; Diagnosis Type:   Discharge ; Confirmation:   Confirmed ; Clinical Dx:   Paraplegia ; Classification:   Medical ; Code:   ICD-10-CM ; Probability:   0 ; Diagnosis Code:   G82.20        Pain Assessment   Self Report Pain :   Numeric rating scale   Rozann Lesches - 05/05/2018 12:04 EDT   Home Environment   Living Environment :   Home Environment  Living Situation:  Home independently  Performed By:  Tory Emerald 04/14/2018  *ADL:  Independent  Performed By:  Odella Aquas 04/12/2018  *Cognitive-Communication Skills:  Independent  Performed By:  Sofie Rower C 04/12/2018  *Instrumental ADL:  Independent  Performed By:  Sofie Rower C 04/12/2018  *Mobility:  Independent  Performed By:  Odella Aquas 04/12/2018  Anticipated Need  for Home Modification:  No  Performed By:  Odella Aquas 04/12/2018  Detail Areas of Responsibilities:  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, septic gas tech); 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  Performed By:  [name], PT, [name] 04/12/2018  Kitchen:  1st floor  Performed By:  Odella Aquas 04/12/2018  Laundry:  1st floor  Performed By:  Sofie Rower C 04/12/2018  Lives In:  Apartment  Performed By:  Odella Aquas 04/12/2018  Number of Stairs Inside:  0  Performed By:  Odella Aquas 04/12/2018  Number of Stairs Outside:  1  Performed By:  Odella Aquas 04/12/2018  Patient's Responsibilities:  Yard work, Data processing manager, Hospital doctor, Employed, Landscape architect, Health and wellness, Water quality scientist, Pharmacologist, Leisure/Play/Hobbies, Manage Medications, Meal preparation, Out to eat, Parenting/Care of others, Personal ADL, Shopping, Social parti...  Performed By:  Sofie Rower C 04/12/2018  Primary Bathroom:  1st floor  Performed By:  Odella Aquas 04/12/2018  Primary Bedroom:  1st floor  Performed By:  Odella Aquas 04/12/2018  Prior Accessibility Options:  Elevator  Performed By:  Odella Aquas 04/12/2018  Sensory Deficits:  Sensation/Touch deficit  Performed By:  [name] 04/09/2018     BENTON, PT, AMBER R - 05/05/2018 16:41 EDT   Living Situation :   Home independently   Lives With :   Family   Lives In :   Single level home   Prior Accessibility Options :   Ramp   Anticipated   Need For Home   Modification :   No   Rozann Lesches - 05/05/2018 12:04 EDT   Stairs     Inside Stairs  Outside Stairs        Number of Stairs :    0    1            Comment   (Comment: Aluminum ramp to enter  Rozann Lesches - 05/05/2018 16:41  EDT] )        Rozann Lesches - 05/05/2018 12:04 EDT  Rozann Lesches - 05/05/2018 12:04 EDT        Home Setup   Primary Bedroom :   1st floor   Primary Bathroom :   1st floor   Kitchen :   1st floor    Laundry :   1st floor   Rozann Lesches - 05/05/2018 12:04 EDT   Patient's Responsibilities :   Yard work, Data processing manager, Hospital doctor, Employed, Landscape architect, Health and wellness, Water quality scientist, Pharmacologist, Leisure/Play/Hobbies, Manage Medications, Meal preparation, Out to eat, Parenting/Care of others, Personal ADL, Shopping, Social participation   Detail Areas of Responsibilities :   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, Engineer, materials); 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   Rozann Lesches - 05/05/2018 12:04 EDT   Home Environment II   Living Environment :   Home Environment  Anticipated Need for Home Modification:  No  Performed By:  [name] 04/09/2018  Detail Areas of Responsibilities:  Suhas was working for [employer] in [place]; has a 37 yo daughter  Performed By:  [name] 04/09/2018  Kitchen:  1st floor  Performed By:  [name] 04/09/2018  Laundry:  1st floor  Performed By:  [name] 04/09/2018  Lives In:  Apartment  Performed By:  [name] 04/09/2018  Number of Stairs Inside:  0  Performed By:  [name] 04/09/2018  Number of Stairs Outside:  0  Performed By:  [name] 04/09/2018  Patient's Responsibilities:  Yard work, Data processing manager, Hospital doctor, Employed, Landscape architect, Financial planner, Health and wellness, Water quality scientist, Pharmacologist, Leisure/Play/Hobbies, Manage Medications, Meal preparation, Out to eat, Parenting/Care of others, Personal ADL, Sho...  Performed By:  [name] 04/09/2018  Primary Bathroom:  1st floor  Performed By:  [name] 04/09/2018  Primary Bedroom:  1st floor  Performed By:  [name] 04/09/2018  Prior Accessibility Options:  Elevator  Performed By:  [name] 04/09/2018  Sensory Deficits:  Sensation/Touch deficit  Performed By:  [name] 04/09/2018     Rozann Lesches - 05/05/2018 12:04 EDT   Prior Functional Status    ADL :   Independent   Mobility :   Independent   Instrumental ADL :   Independent   Cognitive-Communication Skills :   Independent   Rozann Lesches - 05/05/2018 12:04 EDT   LE ROM/Strength   Lt Lower Extremity Strength :   Other: SEE ASIA   BENTON, PT, AMBER R - 05/05/2018 16:41 EDT   BENTON, PT, AMBER R - 05/05/2018 16:41 EDT   LE Overall Range of Motion Grid   Left Lower Extremity Passive Range :   Within functional limits   Right Lower Extremity Passive Range :   Within functional limits   Rozann Lesches - 05/05/2018 12:04 EDT   LE/Trunk Tone   Left Lower Extremity Tone   Left Lower Extremity :   Flaccid   Right Lower Extremity :   Flaccid   BENTON, PT, AMBER R - 05/05/2018 16:41 EDT   UE ROM/Strength   Upper Extremity Overall ROM Grid   Left Upper Extremity Active Range :   Within functional limits   Left Upper Extremity Passive Range :   Within functional limits   Right  Upper Extremity Active Range :   Within functional limits   Right Upper Extremity Passive Range :   Within functional limits   BENTON, PT, AMBER R - 05/05/2018 16:41 EDT   Lt Upper Extremity Strength :   Other: SEE ASIA   Rt Upper Extremity Strength :   Within functional limits   BENTON, PT, AMBER R - 05/05/2018 16:41 EDT   Sensation   Left Upper Extremity Sensation   Light Touch :   Intact   Proprioception :   Intact   Rozann Lesches - 05/05/2018 12:04 EDT   Right Upper Extremity Sensation   Light Touch :   Intact   Proprioception :   Intact   Rozann Lesches - 05/05/2018 12:04 EDT   Left Lower Extremity Sensation   Light Touch :   Impaired   (Comment: SEE ASIA [BENTON, PT, AMBER R - 05/05/2018 16:41 EDT] )   Sharp/Dull :   Impaired   (Comment: SEE ASIA [BENTON, PT, AMBER R - 05/05/2018 16:41 EDT] )   Eliseo Gum, PT, AMBER R - 05/05/2018 16:41 EDT   Right Lower Extremity Sensation   Light Touch :   Impaired   (Comment: SEE ASIA [BENTON, PT, AMBER R - 05/05/2018 16:41 EDT] )   Sharp/Dull :   Impaired   (Comment: SEE AISA [BENTON, PT, AMBER R - 05/05/2018 16:41 EDT] )    Eliseo Gum, PT, AMBER R - 05/05/2018 16:41 EDT   ASIA Assesment   Voluntary Anal Contraction -  Motor :   No   Any Anal Sensation :   No   BENTON, PT, AMBER R - 05/05/2018 16:41 EDT   BENTON, PT, AMBER R - 05/05/2018 16:41 EDT   Right Limbs   C5 Elbow flexors :   5   C6 Wrist extensors :   5   C7 Elbow extensors :   5   C8 Finger flexors-distal phalanx of middle finger :   5   T1 Finger abductors (little finger) :   5   L2 Hip flexors, Right :   0   L3 Knee extensors, Right :   0   L4 Ankle dorsiflexors, Right :   0   L5 Long toe extensors, Right :   0   S1 Ankle plantar flexors, Right :   0   Sand,  Lukas - 05/05/2018 12:04 EDT   Left Limbs   C5 Elbow flexors :   5   C6 Wrist extensors :   5   C7 Elbow extensors :   5   C8 Finger flexors-distal phalanx of middle finger :   5   T1 Finger abductors (little finger) :   5   L2 Hip flexors, Left :   1   L3 Knee extensors, Left :   0   L4 Ankle dorsiflexors, Left :   0   L5 Long toe extensors, Left :   0   S1 Ankle plantar flexors, Left :   0   Sand,  Lukas - 05/05/2018 12:04 EDT   Right Upper Limb Total (max 25) :   25    Right Lower Limb Total (max 25) :   0    Left Upper Limb Total (max 25) :   25    Left Lower Limb Total (max 25) :   1    Combined Upper Limb Total (max 50) :   50    Combined Lower Limb Total (max 50) :  1    Rozann Lesches - 05/05/2018 12:04 EDT   C2 Right :   2   C3 Right :   2   C4 Right :   2   C5 Right :   2   C6 Right :   2   C7 Right :   2   C8 Right :   2   T1 Right :   2   T2 Right :   2   T3 Right :   2   T4 Right :   2   T5 Right :   2   T6 Right :   2   T7 Right :   2   T8 Right :   2   T9 Right :   2   T10 Right :   2   T11 Right :   1   T12 Right :   1   L1 Right :   1   L2 Right :   0   L3 Right :   0   L4 Right :   0   L5 Right :   0   S1 Right :   0   S2 Right :   0   S3 Right :   NT   S4/5 Right :   NT   Rozann Lesches - 05/05/2018 12:04 EDT   C2 Right :   2   C3 Right :   2   C4 Right :   2   C5 Right :   2   C6 Right :   2   C7 Right :   2    C8 Right :   2   T1 Right :   2   T2 Right :   2   T3 Right :   2   T4 Right :   2   T5 Right :   2   T6 Right :   2   T7 Right :   2   T8 Right :   2   T9 Right :   2   T10 Right :   2   T11 Right :   1   T12 Right :   1   L1 Right :   1   L2 Right :   1   L3 Right :   1   L4 Right :   1   L5 Right :   1   S1 Right :   1   S2 Right :   1   S3 Right :   NT   S4/5 Right :   NT   Rozann Lesches - 05/05/2018 12:04 EDT   S4-5 ASIA Light Touch Left   C2 Left :   2   C3 Left :   2   C4 Left :   2   C5 Left :   2   C6 Left :   2   C7 Left :   2   C8 Left :   2   T1 Left :   2   T2 Left :   2   T3 Left :   2   T4 Left :   2   T5 Left :   2   T6 Left :   2   T7 Left :   2   T8  Left :   2   T9 Left :   2   T10 Left :   2   T11 Left :   2   T12 Left :   2   L1 Left :   1   L2 Left :   0   L3 Left :   0   L4 Left :   0   L5 Left :   0   S1 Left :   0   S2 Left :   0   S3 Left :   NT   S4/5 Left :   NT   Rozann Lesches - 05/05/2018 12:04 EDT   C2 Left :   2   C3 Left :   2   C4 Left :   2   C5 Left :   2   C6 Left :   2   C7 Left :   2   C8 Left :   2   T1 Left :   2   T2 Left :   2   T3 Left :   2   T4 Left :   2   T5 Left :   2   T6 Left :   2   T7 Left :   2   T8 Left :   2   T9 Left :   2   T10 Left :   0   T11 Left :   2   T12 Left :   2   L1 Left :   2   L2 Left :   2   L3 Left :   2   L4 Left :   2   L5 Left :   2   S1 Left :   2   S2 Left :   2   S3 Left :   NT   S4/5 Left :   NT   Rozann Lesches - 05/05/2018 12:04 EDT   Right Light Touch Total (max 56) :   37    Right Pin Prick Total (max 56) :   43    Left Light Touch Total (max 56) :   39    Left Pin Prick Total (max 56) :   50    Light Touch Score (max 112) :   76    Pin Prick Score (max 112) :   93    ASIA Start Date/Time :   05/05/2018 9:45 EDT   ASIA End Date/Time :   05/05/2018 10:30 EDT   ASIA Sensory Neuro Level, Right :   T10   ASIA Sensory Neuro Level, Left :   T9   ASIA Motor Neuro Level, Right :   T10   ASIA Motor Neuro Level, Left :   T9   Single Neurological Level  :   T9 ASIA A with ZPP to L2 L side and L1 R side   ASIA Complete or Incomplete :   Complete   Rozann Lesches - 05/05/2018 12:04 EDT   Image 5 -  Images currently included in the form version of this document have not been included in the text rendition version of the form.   Sitting Balance   Dynamic Sitting Balance Assessment Grid   Anterior Shift :   Able   Lateral to the Left Shift :   Able   Lateral to the Right Shift :  Able   Eliseo Gum, PT, AMBER R - 05/05/2018 16:41 EDT   Righting Reactions Grid   Left Protective Reactions :   Present   Right Protective Reactions :   Present   Left Righting Reactions :   Present   Right Righting Reactions :   Present   BENTON, PT, AMBER R - 05/05/2018 16:41 EDT   Static Sitting Balance Assessment Grid   Sits Without UE Support :   Setup   BENTON, PT, AMBER R - 05/05/2018 16:41 EDT   Sits With One UE Support :   Rehab Modified independence   Sits With Two UE Support :   Rehab Modified independence   Rozann Lesches - 05/05/2018 12:04 EDT   Sitting Surface Evaluated Upon :   Bed   Rozann Lesches - 05/05/2018 12:04 EDT     BENTON, PT, AMBER R - 05/05/2018 16:41 EDT     PT Mobility   Mobility Grid   Roll Left :   Rehab Modified independence   Roll Right :   Rehab Modified independence   Roll Prone :   Rehab Modified independence   Roll Supine :   Rehab Modified independence   Supine to Sit :   Rehab Modified independence   Sit to Supine :   Rehab Modified independence   Scooting :   Rehab Modified independence   Sit to Stand :   Does not occur   Stand to Sit :   Does not occur   Transfer Bed to and From Chair :   Rehab Modified independence   Transfer Toilet :   Does not occur   Tub/Shower Transfer :   Does not occur   Car Transfer :   Distant supervision   Floor Recovery :   Does not occur   Rozann Lesches - 05/05/2018 12:04 EDT   Functional Mobility Details :   ..   Rozann Lesches - 05/05/2018 12:04 EDT   Amb Ability Varied Surf/Distraction Grid   Level Surfaces :   Does not occur   Uneven Surfaces  :   Does not occur   Distracting Environments :   Does not occur   Curbs :   Does not occur   Stairs :   Does not occur   Ramp :   Does not occur   Rozann Lesches - 05/05/2018 12:04 EDT   Transfer Type :   Transfer board   PT Mobility Reviewed :   Yes   Rozann Lesches - 05/05/2018 12:04 EDT   PT WC Management   Type of Wheelchair :   Manual wheelchair   Wheelchair Details :   Pt able to negotiate ADA-approved ramps, uneven and even surfaces mod I. For steeper ramps, pt req min A.   Rozann Lesches - 05/05/2018 12:04 EDT   Wheelchair Mobility Grid   Level Surfaces :   Rehab Modified independence   Uneven Surfaces :   Rehab Modified independence   Ramps :   Rehab Modified independence   (Comment: ADA-approved ramps only Rozann Lesches - 05/05/2018 16:41 EDT] )   Wheelies :   Close supervision   (Comment: With anti-tippers Rozann Lesches - 05/05/2018 16:41 EDT] )   Rozann Lesches - 05/05/2018 12:04 EDT   Wheelchair Mobility Level Distance :   300 ft   Uneven Surface Wheelchair Distance :   50 ft   Wheelchair Mobility Reviewed :   Yes  Rozann Lesches - 05/05/2018 12:04 EDT   Special Tests   PT Special Tests Grid     Test #1          Special Tests :    SCIM (mobility section)              Test Results :    16/40              Special Tests Comments :                   Carney Living AMBER R - 05/05/2018 16:41 EDT         DME   PT Equipment Anticipated or Recommended :   Ultralightweight Manual Wheelchair   PT Equipment Issued Rehab :   Manual wheelchair   Additional Comments DME PT :   K5 WC   Rozann Lesches - 05/05/2018 15:23 EDT   Education   Responsible Learner Present for Session :   Yes   Rozann Lesches - 05/05/2018 15:40 EDT   PT Additional Education :   The pt and his family were educated on proper guarding for car transfers and proper disassembly for his WC. Pt and family were educated on proper handplacement and positioning for hamstring and gastroc stretches in pt's HEP, and pt was provided c handout and educated on remainder of his  exercises.     Eliseo Gum, PT, AMBER R - 05/05/2018 16:41 EDT   Home Caregiver Name/Relationship :   sister, Gabriel Cirri - 05/05/2018 12:04 EDT   Rozann Lesches - 05/05/2018 12:04 EDT   Physical Therapy Education Grid   Exercise Program :   Trenton Gammon understanding, Demonstrates   Spinal Cord Specific Education :   [employer] understanding   Wheelchair Positioning :   [employer] understanding, Demonstrates   Rozann Lesches - 05/05/2018 15:40 EDT   Car Transfers :   Trenton Gammon understanding, Demonstrates, Needs practice/supervision   Rozann Lesches - 05/05/2018 15:23 EDT   Bed Mobility :   Verbalizes understanding, Demonstrates   Bed Positioning :   [employer] understanding, Demonstrates   Bed to Chair Transfers :   [employer] understanding, Demonstrates   Rozann Lesches - 05/05/2018 12:04 EDT   Assessment   PT Impairments or Limitations :   Abnormal tone, Balance deficits, Bed mobility deficits, Decreased knowledge of condition, Endurance deficits, Impaired sensation, Pain limiting function, Range of motion deficits, Strength deficits, Transfer deficits, Transition deficits, Wheelchair mobility deficits   BENTON, PT, AMBER R - 05/05/2018 16:41 EDT         Discharge Recommendations :   Pt plan to DC home c family and begin OP PT. Provided c loaner custom K5 WC until pt recieves his own in a few months. Pt cleared to perform SB tsfrs from bed/WC mod I.    SCIM (mobility)=7/40 (eval); 16/40 (D/C)    DME: [employer] w/c      Rozann Lesches - 05/05/2018 15:40 EDT   PT Treatment Recommendations :   Pt is a 37 y.o. M that presented to the IP rehab hospital today for a discharge assessment. Pt has been steadily improving throughout his 4 week IP rehabilitation stay. Today, he demonstrated the ability to perform all bed mobility mod I, transfers mod I c slide board, and a car transfer c distant S c slide board. Patient and family demonstrate proper technique with all transfers and HEP and no longer requires skilled IP  rehab physical therapy. Pt will benefit from continued PT upon DC to furhter progress indep with higher level SCI transfers, progress to independent pressover transfers without SB and higher level WC skills.       BENTON, PT, AMBER R - 05/05/2018 16:41 EDT   Short Term Goals   Bed Mobility Goal Grid     Goal #1  Goal #2  Goal #3  Goal #4    Descriptors :    Supine to sit   Sit to supine   Roll to right and left   Bed mobility     Level :    Moderate assistance   Moderate assistance   Close supervision   Modified independence     Status :    Goal met   Goal met   Goal met   Goal met     Date Met :    04/23/2018 EDT   04/23/2018 EDT   04/23/2018 EDT   04/29/2018 EDT       Rozann Lesches - 05/05/2018 15:40 EDT  Rozann Lesches - 05/05/2018 15:40 EDT  Rozann Lesches - 05/05/2018 15:40 EDT  Rozann Lesches - 05/05/2018 15:40 EDT      Transfers Goal Grid     Goal #1  Goal #2  Goal #3  Goal #4    Descriptors :    Therapist, art   Lateral transfer     Level :    Maximal assistance   Setup   Modified independence   Minimal assistance     Device :    Transfer board   Transfer board   Transfer board        Status :    Goal met   Goal met   Goal met   Not met     Date Met :    04/23/2018 EDT   04/28/2018 EDT   05/05/2018 EDT          Rozann Lesches - 05/05/2018 15:40 EDT  Rozann Lesches - 05/05/2018 15:40 EDT  Rozann Lesches - 05/05/2018 15:40 EDT  Rozann Lesches - 05/05/2018 15:40 EDT      W/C Management Grid     Goal #1  Goal #2  Goal #3      Descriptors :    Mobility with manual wheelchair   Pressure relief forward lean   Manual wheelchair propulsion in community        Level :    Modified independence   Distant supervision   Close supervision        Distance :    300 ft              Details :          Curbs        Status :    Goal met   Goal met   Not met          Rozann Lesches - 05/05/2018 15:40 EDT  Rozann Lesches - 05/05/2018 15:40 EDT  Rozann Lesches - 05/05/2018 15:40 EDT       PT Balance Goal Grid     Goal #1  Goal #2  Goal  #3      Descriptor :    Supported short sit   Unsupported short sit   Unsupported short sit        Assist Level :    Modified  independence   Close supervision   Modified independence        Type :    Static sitting   Static sitting   Dynamic sitting balance        Length of Time (minutes) :    5 minutes   5 minutes           Rationale :          Improve independence with activities of daily living        Status :    Goal met   Goal met   Goal met        Comments :          Pt able to pick item up off floor.          Rozann Lesches - 05/05/2018 15:40 EDT  Rozann Lesches - 05/05/2018 15:40 EDT  Rozann Lesches - 05/05/2018 15:40 EDT       PT ST Goals Reviewed :   Otilio Connors - 05/05/2018 15:40 EDT   Long Term Goals   Outpatient PT Long Term Goals Rehab     Long Term Goal 1  Long Term Goal 2  Long Term Goal 3      Goal :    Pt will improve mobility portion of SCIM  to 17/40 to indicate improved indep with functional mobility following SCI in 3 weeks   Pt will complete car transfer mod I in 3 weeks   Pt will complete floor transfer with mod A in 3 weeks.        Status :    Not met   Not met   Not met        Comments :    Pt improved SCIM to 16/40   Pt able to perform car transfer c distant S and min VC's for hand placement and body positioning.   Pt unable to attempt floor transfer 2/2 biceps tendinitis and previous fx's in BUE. PT's judgement not to attempt to ensure proper healing of these.          Rozann Lesches - 05/05/2018 15:40 EDT  Rozann Lesches - 05/05/2018 15:40 EDT  Rozann Lesches - 05/05/2018 15:40 EDT       PT Mobility Independence Measure LTG   Bed, Chair, Wheelchair Goal :   Modified independence - 6   (Comment: MET [BENTON, PT, AMBER R - 05/05/2018 16:41 EDT] )   Wheelchair Mobility Level Surfaces Goal :   Modified independence - 6   (Comment: MET [BENTON, PT, AMBER R - 05/05/2018 16:41 EDT] )   Eliseo Gum, PT, AMBER R - 05/05/2018 16:41 EDT   PT LTG Reconcilation :   LTG to be met in 3 weeks   Type of Wheelchair Goal :    Manual wheelchair   Mode of Locomotion Goal :   Wheelchair   PT LT Goals Reviewed :   Yes   Rozann Lesches - 05/05/2018 15:40 EDT   Plan   Other PT Treatment Provided :   car transfer training with patient and faimily to car DC home in tomorrow including dissassmembly of rigid frame WC   BENTON, PT, AMBER R - 05/05/2018 16:41 EDT   PT Evaluation Date :   05/05/2018 9:45 EDT   Treatment Plan/Goals Established With Patient/Caregiver :   Yes   Evaluation Complete :   Yes   Rozann Lesches - 05/05/2018 15:40 EDT  Time Spent With Patient   PT Time In :   9:45 EST   PT Time Out :   11:15 EST   Rozann Lesches - 05/05/2018 16:29 EDT   PT Therapeutic Exercise Units :   3 units   PT Therapeutic Exercise Time :   45 minutes   PT ADL TRAINING 15 MN :   2 units   PT ADL Training Time :   30 minutes   PT Wheelchair Management Units :   1 units   PT Wheelchair Management Time :   15 minutes   PT Total Individual Therapy Time :   90 minutes   PT Total Timed Code Treatment Units :   6 units   PT Total Timed Code Tx Minutes :   90 minutes   PT Total Treatment Time Rehab :   90 minutes   Rozann Lesches - 05/05/2018 15:40 EDT   Section GG: Discharge Mobility Functional Abilities   GG0170 Roll Left and Right :   Independent - Patient/Resident completes the activities by him/herself, with or without an assistive device, with no assistance from a helper. - 06   GG0170 Sit to Lying :   Independent - Patient/Resident completes the activities by him/herself, with or without an assistive device, with no assistance from a helper. - 06   GG0170 Lying to Sitting on Side of Bed :   Independent - Patient/Resident completes the activities by him/herself, with or without an assistive device, with no assistance from a helper. - 06   GG0170 Sit to Stand :   Not attempted due to medical condition or safety concerns - 88   GG0170 Chair,Bed to Chair Transfer :   Independent - Patient/Resident completes the activities by him/herself, with or without an assistive  device, with no assistance from a helper. - 06   GG0170 Car Transfer :   Supervision or touching assistance - Helper provides verbal cues and/or touching/steadying and/or contact guard assistance as patient/resident completes activity. Assistance may be provided throughout the activity or intermittently. - 04   GG0170 H3 Patient Walk :   No   Rozann Lesches - 05/05/2018 15:40 EDT   Care Tool Section GG: Mobility Continued   GG0170 Patient Use Wheelchair,Scooter :   Yes   GG0170 Wheel 50 Feet with Two Turns :   Independent - Patient/Resident completes the activities by him/herself, with or without an assistive device, with no assistance from a helper. - 06   GG0170 Type Wheelchair,Scooter Use 49ft :   Manual wheelchair   GG0170 Wheel 150 feet :   Independent - Patient/Resident completes the activities by him/herself, with or without an assistive device, with no assistance from a helper. - 06   GG0170 Type Wheelchair,Scooter Use 134ft :   Manual wheelchair   Rozann Lesches - 05/05/2018 15:40 EDT

## 2018-05-05 NOTE — Progress Notes (Signed)
Interdisciplinary Team Conference - Text       Interdisciplinary Team Conference PF Entered On:  05/05/2018 9:20 EDT    Performed On:  05/05/2018 9:19 EDT by Ottie Glazier, RN, Amy J               Team Members   Primary Care Manager :   Melonie [place]   Primary Nurse :   Jimmey Ralph, RN, Megan A   Primary OT :   Marzella Schlein, OT, [name]   Primary PT :   Eliseo Gum PT, AMBER R   Primary SLP :   Coralie Common   Team Conference Date :   04/29/2018 EDT   Ottie Glazier RN, Amy J - 05/05/2018 9:19 EDT   Nursing Summary.   Bowel Level of Assistance Interim :   Total assistance   Bowel Management :   Continent   Bowel Program :   Digital stimulation   Bowel Movement Last Date :   05/05/2018 EDT   Bladder Level of Assistance Interim :   Supervision or setup   Bladder Management :   Incontinent   Urinary Elimination Management :   Intermittent straight catheter   Tracheostomy Information :   No qualifying data available.       Number Times Suctioned This Shift :   0    Nursing Team Notes Current :   Yes   Varnum, RN, Amy J - 05/05/2018 9:19 EDT

## 2018-05-05 NOTE — Progress Notes (Signed)
Functional Indep Measure Scores - Text       Functional Independence Measure Scores Entered On:  05/05/2018 12:17 EDT    Performed On:  05/05/2018 12:12 EDT by [name]               Eating Score   Patient's Independence Level With Eating Tasks :   Setup/Supervision   Supervision or Setup Needed :   Gather equipment   Functional Independence Measure Eating :   Supervision or setup - 5   [name] - 05/05/2018 12:12 EDT   Grooming Score   Patient's Independence Level with Grooming Tasks :   Setup/Supervision   Supervision or Setup Needed :   Gather equipment   Grooming :   Supervision or setup - 5   [name] - 05/05/2018 12:12 EDT   Bathing Score   Patient's Independence Level with Bathing Tasks :   Assistance   Type of Assistance Necessary :   Assistance with bathing body parts   Body Parts Assessed :   All body surfaces   Tasks Requiring Assistance :   Buttocks   Functional Independence Measure Bathing :   Minimal contact assistance - 4   [name] - 05/05/2018 12:12 EDT   Upper Body Dressing Score   Patient's Independence Level with Upper Body Dressing Tasks :   Setup/Supervision   Supervision or Setup Needed :   Gather clothes   UE Dressing :   Supervision or setup - 5   [name] - 05/05/2018 12:12 EDT   Lower Body Dressing Score   Patient's independence Level with Lower Body Dressing Tasks :   Setup/Supervision   Supervision or Setup Needed :   Gather clothes   LE Dressing :   Supervision or setup - 5   [name] - 05/05/2018 12:12 EDT   Toileting Score   Patient's Independence Level with Toileting Tasks :   Assistance   Type of Assistance Necessary :   Assistance with toileting tasks   Amount of Assistance Needed :   Cleansing perineal area   Toileting :   Moderate assistance - 3   [name] - 05/05/2018 12:12 EDT   Transfer Toilet Score   Patient's Independence Level with Transfer Toilet Tasks :   Assistance   Amount of Assistance Necessary :   Incidental help for contact guard or  steadying   Transfer Toilet :   Minimal contact assistance - 4   [name] - 05/05/2018 12:12 EDT   Transfer Tub Score                 [name], OT, [name] - 05/07/2018 10:24 EDT       Transfer Shower Score   Patient's independence Level with Transfer Shower Tasks :   Assistance   Type of Assistance Necessary :   No more help than touching (patient performs 75% or more of tasks)   Shower Transfer :   Minimal contact assistance - 4   [name], OT, ELIZABETH G - 05/07/2018 10:24 EDT   Comprehension Score   Comprehends Complex or Abstract Information Without Prompting or Cueing :   Yes   Mode of Comprehension :   Auditory   Understands Complex or Abstract Directions and Conversations :   At all times   Functional Independence Measure Comprehension :   Complete independence - 7   [name] - 05/05/2018 12:12 EDT   Expression Score  Expresses Complex or Abstract Information Without Prompting or Cueing :   Yes   Expression Mode :   Vocal   Expresses Complex or Abstract Ideas :   Clearly and fluently at all times   Functional Independence Measure Expression :   Complete independence - 7   [name] - 05/05/2018 12:12 EDT   Social Interaction Score   Interacts Appropriately Without Supervision :   Yes   Interacts Appropriately :   At all times   Functional Independence Measure Social Interaction :   Complete independence - 7   [name] - 05/05/2018 12:12 EDT   Problem Solving Score   Solves Complex Problems :   Yes   Ability to Solve Complex Problems :   Consistently solves problems independently   Functional Independence Measure Problem Solving :   Complete independence - 7   [name] - 05/05/2018 12:12 EDT   Memory Score   Memory Score Comments :   FIMs reviewed by [name], [name]   [name], OT, [name] - 05/05/2018 12:59 EDT   Recognizes, Remembers Routines, and Executes Requests Without Prompting :   Yes   Remembers and Executes Requests :   Consistently without need for  repetition   Functional Independence Measure Memory :   Complete independence - 7   [name] - 05/05/2018 12:12 EDT

## 2018-05-06 NOTE — Care Plan (Signed)
 [name]     Patient Education Materials Follows:  ED/Trauma        Spinal Cord Injury: Treatment and Rehabilitation       Physical therapy can help with rehabilitation after a spinal cord injury.    Treatment of a spinal cord injury starts at the place of the accident and continues in the emergency room (ER). Then the injured person will be admitted to the hospital. Or he or she will be moved to a spinal cord injury treatment center.    Treatment   Once vital signs and heart rate are stabilized, there are additional procedures that can be vital in treating spinal cord injuries:    Relief of pressure on the spine. This is done using surgery or traction (a mechanical system of weights).    Treatment to stabilize the spine. Screws, metal plates, and other devices may be placed during surgery. In some cases, traction or a brace may be used instead.   Rehabilitation   First the injury is stabilized, either with surgery, bracing, or both. Then, supportive care and rehabilitation (rehab) are the goals. Supportive care helps prevent other health problems. This may include skin sores. Rehab supports a persons emotional and physical recovery. It includes:    Physical therapy. This supports strength and movement in muscles and joints. It may help some people with spinal cord injuries regain some function.    Counseling. Spinal cord injury can have permanent effects. Counseling helps the injured person and loved ones cope and adjust.   Outlook for the future   It was once believed that damaged nerve cells couldnt be repaired. But recent studies show this may not be true. Now, scientists are searching for ways to regrow injured nerves. The outlook for people with spinal cord injuries is brighter today than ever before.   For more information   To learn more about spinal cord injuries, contact:   National Spinal Cord Injury Association  [phone]  www.spinalcord.org      2000-2017 [employer] [address], [place], [place] [address]. All rights reserved. This information is not intended as a substitute for professional medical care. Always follow your healthcare professional's instructions.     Neurosurgery        Spinal Cord Injury (SCI): Managing Your Bowel   After an SCI, your bowel may not work the same way as before. To help you adjust to and manage the changes, your healthcare team has helped you create a bowel program to follow on a regular basis. Its up to you to put this program into practice. Doing so will help you remain active, social, and healthy.   SCI causes bowel changes   SCI can damage the nerves involved with bowel function. As a result, you may not be able to tell when you need to have a bowel movement. How your bowel is affected depends on the level and severity of your injury:    An upper level SCI (T12 or higher) may cause a spastic (reflex) bowel. With this type of problem, a reflex is triggered when your bowel is full. This causes your bowel to empty on its own, leading to accidents.    A lower level SCI (below T12) may cause a flaccid bowel. With this type of problem, the muscle around the anus remains open, but the bowel has trouble emptying on its own. Stool needs to be manually removed to prevent leakage and the bowel becoming blocked with  too much stool.   Tips for a successful bowel program   Your bowel program gives you greater control of your bowel function. This helps you have regular bowel movements with fewer accidents. It also helps reduce your risk of complications. To ensure the success of your bowel program:    Do your bowel routine as instructed. You will have a bowel routine to complete each day to remove stool from your bowel. The frequency of this routine may change as you find out what works best for you. Most bowel routines take about 45 to 60 minutes. Your healthcare team will work with you to make sure you can perform all of the steps to your bowel routine  safely and well.    Stick to your bowel care schedule. Doing your bowel routine on a schedule helps retrain your bowel to have regular bowel movements. This reduces your risk of accidents. You may also find it helpful to keep a bowel care record to track how well your bowel routine is working.    Follow the diet instructions you are given. What you eat and drink affects how well your bowel works. You may need to increase the amount of fiber in your diet. Fiber adds bulk to stool and makes stool softer and easier to pass. You may also need to avoid certain foods that cause gas or make your stool too soft (diarrhea) or hard (constipation). In addition, you may need to drink plenty of fluids each day. Water is the best choice.    Exercise regularly. Being active helps keep stool moving through the bowel. This makes constipation and blockage less likely. You may be taught range-of-motion exercises and position changes that make bowel movements easier to pass.    Take medicines as directed. You may have been prescribed medicines to help stimulate a bowel movement during your bowel routine. You may also be prescribed stool softeners. Do not use laxatives without talking to your healthcare provider first. These can worsen bowel problems over time.    See your healthcare provider and healthcare team for regular visits. These help monitor your health. If you need to change part of your bowel care program, let your healthcare provider know right away. Together, you can make adjustments that work for you.   When to call the healthcare provider   Call your healthcare provider right away if you have any of the following signs of a bowel issue:    Fever of 100.61F (38C) or higher    Hard, loose, or watery stools    Black, tarry stools    Bleeding from the rectum    Stomach pain    Swollen or hard stomach    Nausea or vomiting    Weight loss    Loss of appetite   NOTE: Autonomic dysreflexia (AD) is a problem that  can happen in some people with SCI. It causes a sudden spike in blood pressure that can be dangerous if not treated. Bowel problems can sometimes trigger AD. Your healthcare provider will let you know if youre at risk for AD and what to do if it happens.   Getting support   Dont let your injury or bowel problem keep you from living an active and full life. Your body may have gone through some changes, but youre still the same person. And you can still do many of things youve always enjoyed. This includes working, playing sports, hanging out with friends, and dating. If you need more support,  let your healthcare team know. They can refer you to counseling, if needed. Also, reach out to family and friends and let them know how youre feeling. You may find it helpful to join a support group as well. This allows you to talk with other people who are going through similar experiences as you.      2000-2017 [employer] [address] [place], [place] [address]. All rights reserved. This information is not intended as a substitute for professional medical care. Always follow your healthcare professional's instructions.           Spinal Cord Injury (SCI): Managing Your Bladder   After an SCI, your bladder may not work the same way as before. During your rehabilitation, your healthcare team gave you a bladder program to help you adjust to and manage these changes. Going forward, it will be up to you to follow this program on a regular basis. By taking control of your body and managing your bladder, you will help prevent accidents and complications. This will allow you to stay active and social, and help keep you healthy.        SCI affects your bladder   SCI can damage the nerves involved with bladder function. How your bladder is affected depends on the level and severity of your injury.    An upper level SCI (T12 or higher) may cause a spastic (reflex) bladder. With this type of problem, a reflex is  triggered when your bladder is full. It causes your bladder muscles to contract to release urine. You cant tell or feel when this happens. As a result, accidents can happen. Some people with spastic bladder also have problems relaxing the muscles around the bladder (sphincter muscles). This means urine cant be emptied out of the bladder properly.     A lower level SCI (below T12) may cause a flaccid bladder. With this type of problem, your bladder muscles do not contract to release urine when your bladder is full. As a result, your bladder can become too full. This can damage your bladder and lead to other problems.    Your bladder program   The goal of your bladder program is to give you greater control of your bladder function. It allows you to empty your bladder of urine using a method that is safe and effective for you. Some common methods include:    Catheterization. This involves the use of a thin, flexible tube called a catheter. Once the tube is in place, urine drains through the tube into a bag or collection device. There are many types of catheterization. In some cases, the tube stays in place at all times. In others, the tube is only inserted when it is time to urinate. If you can afford it, getting a bladder scanner is a good idea. The bladder scanner will allow you to know how much urine is in your bladder and when to catheterize yourself (usually when there is more than 350 ml).      Stimulated voiding. This may be used if you still have some control of your bladder. Through various techniques, such as pressing down on your bladder with your fist, you may be able to trigger your bladder muscles to contract and release urine.    Surgery. Various types of surgery may be done to improve the function of your bladder. Or surgery may be done to bypass the bladder and create a new way for urine to leave the body.  Your healthcare provider can tell you more about your choices and help you choose the  method that is best for you.   Steps to prevent urinary complications   With any type of bladder problem, there is a higher risk for urinary complications. These can include urinary tract infections (UTIs) and kidney problems. To help protect your urinary tract and stay healthy:    Know the signs and symptoms of a UTI. See below for more information.    Empty your bladder completely and on a regular schedule. A too-full bladder causes leakage. It can also lead to infection or reflux (when urine backs up in the ureters and kidneys). It can trigger a problem called autonomic dysreflexia (AD) as well. This is a sudden spike in blood pressure that must be treated right away. (Ask your healthcare provider if youre at risk for AD.)    Use sterile techniques during catheterization. This helps prevent infection. Wash your hands before and after each catheterization. Also, check that catheters are not blocked or kinked. This may prevent proper drainage of urine.    Drink plenty of water. Water helps flush bacteria out of your system. This can help prevent UTIs and bladder infections. This can also help prevent bladder and kidney stones.    See your healthcare provider and healthcare team for regular visits. They help monitor your health. If you need to change any part of your bladder care program, let them know right away. Together, you can make adjustments that work for you.   When to call the healthcare provider   Call your healthcare provider right away if you have any of the following signs of a UTI or other bladder issue:    Cloudy or bad-smelling urine    Burning feeling in the urethra or genital area    Aching in the abdomen or back (kidney area)    Increased spasms in the abdomen, legs, or bladder    Fever of 100.21F (38.0C) or higher    Chills or sweating    Nausea or vomiting    Feeling very tired   Getting support   You dont have to limit your activities because of your injury or bladder problem.  With time and practice, your bladder program will become part of your daily routine. And you can go on doing many of the things youve always enjoyed. This includes working, playing sports, hanging out with friends, and dating. If you need further support, let your healthcare team know. They can refer you to counseling, if needed. Also, reach out to family and friends and let them know how youre feeling. You may find it helpful to join a support group as well. This allows you to talk with other people who are going through similar experiences as you.      2000-2017 [employer] [address], [place], [place] [address]. All rights reserved. This information is not intended as a substitute for professional medical care. Always follow your healthcare professional's instructions.           Spinal Cord Injury (SCI): Your Transition Home   Leaving a hospital or rehabilitation facility to return to your home after your injury takes a lot of planning. You, your family, and your SCI care team will work together. This sheet gives you an overview of what to consider before and after your transition. Make sure to play an active role during this time. Its important that you are fully involved in planning your  move home carefully so it works best for you.      Planning your transition   Your SCI care team will help you develop your discharge plan. The goal is to help you plan for a living situation that gives you both support and independence. Think about your needs and weigh your options. You will need to make choices about how to adapt your home, transfer your routines, and stay active. With your permission, your family or other caregivers will be invited to meetings to talk about the plan. You will work together with the SCI care team to carry it out.   Things to think about   When planning your return home, consider:    Modifications and equipment. Think about home modifications, such as ramps and other  changes that will make spaces in your home accessible. You may need adaptive equipment such as a hospital bed or tools to help with daily living. Your SCI care team will help you get the equipment you need.    Caregiving. You may need a caregiver to help with certain tasks. This could be a family member, friend, or someone from the community or an agency. The person may be paid or unpaid.    Your health needs. Think about the best ways to take care of your health in your living space. How will your self-care routines work best at home? How will you keep track of your medications? How will you plan and eat meals to be sure you get the nutrition you need? Think about how these and other healthcare needs will work at home.    Your finances. What costs can you cover? What will you need help with? Talk to your social worker about finding resources to help pay for what you need.    Getting around. If you can drive, does your vehicle have the needed adaptations? If you cannot drive, does your neighborhood or city have public transportation that is accessible? Local paratransit services may be able to provide transport. You may decide to drive an adapted vehicle. Or you may have to arrange for a friend, family member, or other caregiver to drive you.    Being active. Think about how you want to spend your time after your transition home. Life is not just about self-care routines. Its important to be active and pursue interests. Do you want to train for a job? Are there local groups youd like to join, or a hobby you want to try? What type of fun and recreation will you do? What do you want your social life to be? Talk about your interests with your SCI care team. They can help you find resources to accomplish your goals for being social and active.   Alternative living situations   You may need more support for a period of time than youll be able to get while living at home. You may want to first transfer to a living  situation that has on-site care. There are many types of places to live that may suit your needs. They range from facilities with 24-hour medical staffing to those with occasional assistance. Or, you may go to the home of a family member or friend who can assist you. A nursing facility has nurses on site around the clock that can handle many medical issues. Assisted-living centers offer help with aspects of daily life, such as housecleaning, meals, errands, and personal care. Licensed board and care homes provide services in a family home setting. Talk  to your SCI care team about what type of living situation might suit you the best.   Support after transition   After your transition home, there is no need to go it alone. There are many people and organizations ready to help you. Your SCI care team will continue to help you after your discharge. Talk to family or friends about the roles they can play in helping you. Look for online and local support groups. Peer counselors are often a great help. You can also get support from outreach staff at local agencies. And you may choose to talk to a counselor. Talk with your SCI care team at any time if you need help finding more support.   Resources   For more information about SCI, go to:    The National Spinal Cord Injury Foundation www.spinalcord.[employer] www.christopherreeve.org    [employer] [place] http://www.frank.com/    [employer] Independent Living (NC[facility]) http://www.ball-ray.net/    [employer].wheelchairnet.org    [employer].rutgers.edu    [employer] https://www.moore-west.com/      2000-2017 [employer] [address] [place], [place] [address]. All rights reserved. This information is not intended as a substitute for professional medical care. Always follow your healthcare professional's instructions.

## 2018-05-06 NOTE — Care Plan (Signed)
ED [facility]     Patient Education Materials Follows:  ED/Trauma        Spinal Cord Injury: Treatment and Rehabilitation       Physical therapy can help with rehabilitation after a spinal cord injury.    Treatment of a spinal cord injury starts at the place of the accident and continues in the emergency room (ER). Then the injured person will be admitted to the hospital. Or he or she will be moved to a spinal cord injury treatment center.    Treatment   Once vital signs and heart rate are stabilized, there are additional procedures that can be vital in treating spinal cord injuries:   ?? Relief of pressure on the spine. This is done using surgery or traction (a mechanical system of weights).   ?? Treatment to stabilize the spine. Screws, metal plates, and other devices may be placed during surgery. In some cases, traction or a brace may be used instead.   Rehabilitation   First the injury is stabilized, either with surgery, bracing, or both. Then, supportive care and rehabilitation (rehab) are the goals. Supportive care helps prevent other health problems. This may include skin sores. Rehab supports a persons emotional and physical recovery. It includes:   ?? Physical therapy. This supports strength and movement in muscles and joints. It may help some people with spinal cord injuries regain some function.   ?? Counseling. Spinal cord injury can have permanent effects. Counseling helps the injured person and loved ones cope and adjust.   Outlook for the future   It was once believed that damaged nerve cells couldnt be repaired. But recent studies show this may not be true. Now, scientists are searching for ways to regrow injured nerves. The outlook for people with spinal cord injuries is brighter today than ever before.   For more information   To learn more about spinal cord injuries, contact:   National Spinal Cord Injury Association  [phone]  www.spinalcord.org     ?? 2000-2017 [employer] [address], [place] [address]. All rights reserved. This information is not intended as a substitute for professional medical care. Always follow your healthcare professional's instructions.

## 2018-05-06 NOTE — Care Plan (Signed)
[name]     Patient Education Materials Follows:  ED/Trauma        Spinal Cord Injury: Treatment and Rehabilitation       Physical therapy can help with rehabilitation after a spinal cord injury.    Treatment of a spinal cord injury starts at the place of the accident and continues in the emergency room (ER). Then the injured person will be admitted to the hospital. Or he or she will be moved to a spinal cord injury treatment center.    Treatment   Once vital signs and heart rate are stabilized, there are additional procedures that can be vital in treating spinal cord injuries:   ?? Relief of pressure on the spine. This is done using surgery or traction (a mechanical system of weights).   ?? Treatment to stabilize the spine. Screws, metal plates, and other devices may be placed during surgery. In some cases, traction or a brace may be used instead.   Rehabilitation   First the injury is stabilized, either with surgery, bracing, or both. Then, supportive care and rehabilitation (rehab) are the goals. Supportive care helps prevent other health problems. This may include skin sores. Rehab supports a persons emotional and physical recovery. It includes:   ?? Physical therapy. This supports strength and movement in muscles and joints. It may help some people with spinal cord injuries regain some function.   ?? Counseling. Spinal cord injury can have permanent effects. Counseling helps the injured person and loved ones cope and adjust.   Outlook for the future   It was once believed that damaged nerve cells couldnt be repaired. But recent studies show this may not be true. Now, scientists are searching for ways to regrow injured nerves. The outlook for people with spinal cord injuries is brighter today than ever before.   For more information   To learn more about spinal cord injuries, contact:   National Spinal Cord Injury Association  [id number]  www.spinalcord.org     ?? 2000-2017 [employer] [address] [place], [place] [address]. All rights reserved. This information is not intended as a substitute for professional medical care. Always follow your healthcare professional's instructions.     Neurosurgery        Spinal Cord Injury (SCI): Your Transition Home   Leaving a hospital or rehabilitation facility to return to your home after your injury takes a lot of planning. You, your family, and your SCI care team will work together. This sheet gives you an overview of what to consider before and after your transition. Make sure to play an active role during this time. Its important that you are fully involved in planning your move home carefully so it works best for you.      Planning your transition   Your SCI care team will help you develop your discharge plan. The goal is to help you plan for a living situation that gives you both support and independence. Think about your needs and weigh your options. You will need to make choices about how to adapt your home, transfer your routines, and stay active. With your permission, your family or other caregivers will be invited to meetings to talk about the plan. You will work together with the SCI care team to carry it out.   Things to think about   When planning your return home, consider:   ?? Modifications and equipment. Think about home modifications, such as ramps and other  changes that will make spaces in your home accessible. You may need adaptive equipment such as a hospital bed or tools to help with daily living. Your SCI care team will help you get the equipment you need.   ?? Caregiving. You may need a caregiver to help with certain tasks. This could be a family member, friend, or someone from the community or an agency. The person may be paid or unpaid.   ?? Your health needs. Think about the best ways to take care of your health in your living space. How will your self-care routines work best at home? How will you keep track of your medications? How  will you plan and eat meals to be sure you get the nutrition you need? Think about how these and other healthcare needs will work at home.   ?? Your finances. What costs can you cover? What will you need help with? Talk to your social worker about finding resources to help pay for what you need.   ?? Getting around. If you can drive, does your vehicle have the needed adaptations? If you cannot drive, does your neighborhood or city have public transportation that is accessible? Local paratransit services may be able to provide transport. You may decide to drive an adapted vehicle. Or you may have to arrange for a friend, family member, or other caregiver to drive you.   ?? Being active. Think about how you want to spend your time after your transition home. Life is not just about self-care routines. Its important to be active and pursue interests. Do you want to train for a job? Are there local groups youd like to join, or a hobby you want to try? What type of fun and recreation will you do? What do you want your social life to be? Talk about your interests with your SCI care team. They can help you find resources to accomplish your goals for being social and active.   Alternative living situations   You may need more support for a period of time than youll be able to get while living at home. You may want to first transfer to a living situation that has on-site care. There are many types of places to live that may suit your needs. They range from facilities with 24-hour medical staffing to those with occasional assistance. Or, you may go to the home of a family member or friend who can assist you. A nursing facility has nurses on site around the clock that can handle many medical issues. Assisted-living centers offer help with aspects of daily life, such as housecleaning, meals, errands, and personal care. Licensed board and care homes provide services in a family home setting. Talk to your SCI care team about what  type of living situation might suit you the best.   Support after transition   After your transition home, there is no need to go it alone. There are many people and organizations ready to help you. Your SCI care team will continue to help you after your discharge. Talk to family or friends about the roles they can play in helping you. Look for online and local support groups. Peer counselors are often a great help. You can also get support from outreach staff at local agencies. And you may choose to talk to a counselor. Talk with your SCI care team at any time if you need help finding more support.   Resources   For more information about SCI, go to:   ??  The National Spinal Cord Injury Foundation www.spinalcord.org   ?? [employer] www.christopherreeve.org   ?? [employer] www.pva.org   ?? National Council on Independent Living (NCIL) www.ncil.org   ?? [employer] www.wheelchairnet.org   ?? [employer] sci.[employer].edu   ?? [employer] www.unitedspinal.org     ?? 2000-2017 [employer] [address], [place] [address]. All rights reserved. This information is not intended as a substitute for professional medical care. Always follow your healthcare professional's instructions.

## 2018-05-06 NOTE — Discharge Summary (Signed)
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 allows you to manage your health, view your test results, and retrieve your discharge documents from your hospital stay securely and conveniently from your computer.  To begin the enrollment process, visit [email or web address] Click on "Sign up now" under [facility].
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
## 2018-05-06 NOTE — Case Communication (Signed)
Care Management Discharge Plan - Text       Care Management Discharge Plan Entered On:  05/06/2018 10:37 EDT    Performed On:  05/06/2018 10:37 EDT by Gasper Lloyd S               Care Management Discharge Plan   CM Barriers to Safe Discharge :   Medical diagnosis   Melonie [place] - 05/06/2018 10:37 EDT   Discharge IRF-PAI   Discharge to Living Setting :   Home - private home/apartment   Admit Lives With IRF-PAI :   Family or relatives   IRF-PAI Program Interruptions :   No   Melonie [place] - 05/06/2018 10:37 EDT

## 2018-05-06 NOTE — Nursing Note (Signed)
Nursing Discharge Summary - Text       Nursing Discharge Summary Entered On:  05/06/2018 12:59 EDT    Performed On:  05/06/2018 12:57 EDT by Nickolas Madrid, RN, [facility]               DC Information   Discharge To, Anticipated :   Home with family support, Home with responsible caregiver   Devices/Equipment :   Commode (3 in 1) BSC, Wheelchair - Artist, Anticipated :   Occupational Therapy, Physical Therapy   Transportation :   Private vehicle   Accompanied By :   Myrtie Hawk, Parent   Mode of Discharge :   Wheelchair   [place], RN, [facility] - 05/06/2018 12:57 EDT   Education   Responsible Learner(s) :   Living Situation: Home independently        Performed by: [name] - 05/05/2018 12:17  Discharge To: Home with family support, Home with responsible caregiver        Performed by: Melonie [place] - 05/05/2018 12:11  Home Caregiver Name/Relationship: girlfriend and parents        Performed by: [name] - 05/05/2018 12:17     Home Caregiver Present for Session :   Yes   Barriers To Learning :   None evident   Teaching Method :   Demonstration, Explanation, Printed materials   Nickolas Madrid RN, [place] - 05/06/2018 12:57 EDT   Post-Hospital Education Adult Grid   Activity Expectations :   Verbalizes understanding   Bladder Management :   Verbalizes understanding   Bowel Management :   Verbalizes understanding   Importance of Follow-Up Visits :   Verbalizes understanding   Pain Management :   Verbalizes understanding   Physical Limitations :   Verbalizes understanding   Plan of Care :   [employer], RN, [place] - 05/06/2018 12:57 EDT   Medication Education Adult Grid   Med Dosage, Route, Scheduling :   TEFL teacher understanding   Med Generic/Brand Name, Purpose, Action :   Verbalizes understanding   [place], RN, [place] - 05/06/2018 12:57 EDT   Additional Learner(s) Present :   Spouse   Time Spent Educating Patient :   20 minutes   [place], RN, [place] - 05/06/2018 12:57 EDT

## 2018-05-06 NOTE — Care Plan (Signed)
[facility]     Patient Education Materials Follows:  ED/Trauma        Spinal Cord Injury: Treatment and Rehabilitation       Physical therapy can help with rehabilitation after a spinal cord injury.    Treatment of a spinal cord injury starts at the place of the accident and continues in the emergency room (ER). Then the injured person will be admitted to the hospital. Or he or she will be moved to a spinal cord injury treatment center.    Treatment   Once vital signs and heart rate are stabilized, there are additional procedures that can be vital in treating spinal cord injuries:   ?? Relief of pressure on the spine. This is done using surgery or traction (a mechanical system of weights).   ?? Treatment to stabilize the spine. Screws, metal plates, and other devices may be placed during surgery. In some cases, traction or a brace may be used instead.   Rehabilitation   First the injury is stabilized, either with surgery, bracing, or both. Then, supportive care and rehabilitation (rehab) are the goals. Supportive care helps prevent other health problems. This may include skin sores. Rehab supports a persons emotional and physical recovery. It includes:   ?? Physical therapy. This supports strength and movement in muscles and joints. It may help some people with spinal cord injuries regain some function.   ?? Counseling. Spinal cord injury can have permanent effects. Counseling helps the injured person and loved ones cope and adjust.   Outlook for the future   It was once believed that damaged nerve cells couldnt be repaired. But recent studies show this may not be true. Now, scientists are searching for ways to regrow injured nerves. The outlook for people with spinal cord injuries is brighter today than ever before.   For more information   To learn more about spinal cord injuries, contact:   National Spinal Cord Injury Association  800-962-9629  www.spinalcord.org     ?? 2000-2017 [employer] [address], [place] [address]. All rights reserved. This information is not intended as a substitute for professional medical care. Always follow your healthcare professional's instructions.     Neurosurgery        Spinal Cord Injury (SCI): Managing Your Bowel   After an SCI, your bowel may not work the same way as before. To help you adjust to and manage the changes, your healthcare team has helped you create a bowel program to follow on a regular basis. Its up to you to put this program into practice. Doing so will help you remain active, social, and healthy.   SCI causes bowel changes   SCI can damage the nerves involved with bowel function. As a result, you may not be able to tell when you need to have a bowel movement. How your bowel is affected depends on the level and severity of your injury:   ?? An upper level SCI (T12 or higher) may cause a spastic (reflex) bowel. With this type of problem, a reflex is triggered when your bowel is full. This causes your bowel to empty on its own, leading to accidents.   ?? A lower level SCI (below T12) may cause a flaccid bowel. With this type of problem, the muscle around the anus remains open, but the bowel has trouble emptying on its own. Stool needs to be manually removed to prevent leakage and the bowel becoming blocked with   too much stool.   Tips for a successful bowel program   Your bowel program gives you greater control of your bowel function. This helps you have regular bowel movements with fewer accidents. It also helps reduce your risk of complications. To ensure the success of your bowel program:   ?? Do your bowel routine as instructed. You will have a bowel routine to complete each day to remove stool from your bowel. The frequency of this routine may change as you find out what works best for you. Most bowel routines take about 45 to 60 minutes. Your healthcare team will work with you to make sure you can perform all of the steps to your bowel routine  safely and well.   ?? Stick to your bowel care schedule. Doing your bowel routine on a schedule helps retrain your bowel to have regular bowel movements. This reduces your risk of accidents. You may also find it helpful to keep a bowel care record to track how well your bowel routine is working.   ?? Follow the diet instructions you are given. What you eat and drink affects how well your bowel works. You may need to increase the amount of fiber in your diet. Fiber adds bulk to stool and makes stool softer and easier to pass. You may also need to avoid certain foods that cause gas or make your stool too soft (diarrhea) or hard (constipation). In addition, you may need to drink plenty of fluids each day. Water is the best choice.   ?? Exercise regularly. Being active helps keep stool moving through the bowel. This makes constipation and blockage less likely. You may be taught range-of-motion exercises and position changes that make bowel movements easier to pass.   ?? Take medicines as directed. You may have been prescribed medicines to help stimulate a bowel movement during your bowel routine. You may also be prescribed stool softeners. Do not use laxatives without talking to your healthcare provider first. These can worsen bowel problems over time.   ?? See your healthcare provider and healthcare team for regular visits. These help monitor your health. If you need to change part of your bowel care program, let your healthcare provider know right away. Together, you can make adjustments that work for you.   When to call the healthcare provider   Call your healthcare provider right away if you have any of the following signs of a bowel issue:   ?? Fever of 100.4??F (38??C) or higher   ?? Hard, loose, or watery stools   ?? Black, tarry stools   ?? Bleeding from the rectum   ?? Stomach pain   ?? Swollen or hard stomach   ?? Nausea or vomiting   ?? Weight loss   ?? Loss of appetite   NOTE: Autonomic dysreflexia (AD) is a problem that  can happen in some people with SCI. It causes a sudden spike in blood pressure that can be dangerous if not treated. Bowel problems can sometimes trigger AD. Your healthcare provider will let you know if youre at risk for AD and what to do if it happens.   Getting support   Dont let your injury or bowel problem keep you from living an active and full life. Your body may have gone through some changes, but youre still the same person. And you can still do many of things youve always enjoyed. This includes working, playing sports, hanging out with friends, and dating. If you need more support,   let your healthcare team know. They can refer you to counseling, if needed. Also, reach out to family and friends and let them know how youre feeling. You may find it helpful to join a support group as well. This allows you to talk with other people who are going through similar experiences as you.     ?? 2000-2017 [employer] [address], [place] [address]. All rights reserved. This information is not intended as a substitute for professional medical care. Always follow your healthcare professional's instructions.           Spinal Cord Injury (SCI): Managing Your Bladder   After an SCI, your bladder may not work the same way as before. During your rehabilitation, your healthcare team gave you a bladder program to help you adjust to and manage these changes. Going forward, it will be up to you to follow this program on a regular basis. By taking control of your body and managing your bladder, you will help prevent accidents and complications. This will allow you to stay active and social, and help keep you healthy.        SCI affects your bladder   SCI can damage the nerves involved with bladder function. How your bladder is affected depends on the level and severity of your injury.   ?? An upper level SCI (T12 or higher) may cause a spastic (reflex) bladder. With this type of problem, a reflex is  triggered when your bladder is full. It causes your bladder muscles to contract to release urine. You cant tell or feel when this happens. As a result, accidents can happen. Some people with spastic bladder also have problems relaxing the muscles around the bladder (sphincter muscles). This means urine cant be emptied out of the bladder properly.    ?? A lower level SCI (below T12) may cause a flaccid bladder. With this type of problem, your bladder muscles do not contract to release urine when your bladder is full. As a result, your bladder can become too full. This can damage your bladder and lead to other problems.    Your bladder program   The goal of your bladder program is to give you greater control of your bladder function. It allows you to empty your bladder of urine using a method that is safe and effective for you. Some common methods include:   ?? Catheterization. This involves the use of a thin, flexible tube called a catheter. Once the tube is in place, urine drains through the tube into a bag or collection device. There are many types of catheterization. In some cases, the tube stays in place at all times. In others, the tube is only inserted when it is time to urinate. If you can afford it, getting a bladder scanner is a good idea. The bladder scanner will allow you to know how much urine is in your bladder and when to catheterize yourself (usually when there is more than 350 ml).     ?? Stimulated voiding. This may be used if you still have some control of your bladder. Through various techniques, such as pressing down on your bladder with your fist, you may be able to trigger your bladder muscles to contract and release urine.   ?? Surgery. Various types of surgery may be done to improve the function of your bladder. Or surgery may be done to bypass the bladder and create a new way for urine to leave the body.     Your healthcare provider can tell you more about your choices and help you choose the  method that is best for you.   Steps to prevent urinary complications   With any type of bladder problem, there is a higher risk for urinary complications. These can include urinary tract infections (UTIs) and kidney problems. To help protect your urinary tract and stay healthy:   ?? Know the signs and symptoms of a UTI. See below for more information.   ?? Empty your bladder completely and on a regular schedule. A too-full bladder causes leakage. It can also lead to infection or reflux (when urine backs up in the ureters and kidneys). It can trigger a problem called autonomic dysreflexia (AD) as well. This is a sudden spike in blood pressure that must be treated right away. (Ask your healthcare provider if youre at risk for AD.)   ?? Use sterile techniques during catheterization. This helps prevent infection. Wash your hands before and after each catheterization. Also, check that catheters are not blocked or kinked. This may prevent proper drainage of urine.   ?? Drink plenty of water. Water helps flush bacteria out of your system. This can help prevent UTIs and bladder infections. This can also help prevent bladder and kidney stones.   ?? See your healthcare provider and healthcare team for regular visits. They help monitor your health. If you need to change any part of your bladder care program, let them know right away. Together, you can make adjustments that work for you.   When to call the healthcare provider   Call your healthcare provider right away if you have any of the following signs of a UTI or other bladder issue:   ?? Cloudy or bad-smelling urine   ?? Burning feeling in the urethra or genital area   ?? Aching in the abdomen or back (kidney area)   ?? Increased spasms in the abdomen, legs, or bladder   ?? Fever of 100.4??F (38.0??C) or higher   ?? Chills or sweating   ?? Nausea or vomiting   ?? Feeling very tired   Getting support   You dont have to limit your activities because of your injury or bladder problem.  With time and practice, your bladder program will become part of your daily routine. And you can go on doing many of the things youve always enjoyed. This includes working, playing sports, hanging out with friends, and dating. If you need further support, let your healthcare team know. They can refer you to counseling, if needed. Also, reach out to family and friends and let them know how youre feeling. You may find it helpful to join a support group as well. This allows you to talk with other people who are going through similar experiences as you.     ?? 2000-2017 [employer]. [address], [place] [address]. All rights reserved. This information is not intended as a substitute for professional medical care. Always follow your healthcare professional's instructions.           Spinal Cord Injury (SCI): Your Transition Home   Leaving a hospital or rehabilitation facility to return to your home after your injury takes a lot of planning. You, your family, and your SCI care team will work together. This sheet gives you an overview of what to consider before and after your transition. Make sure to play an active role during this time. Its important that you are fully involved in planning your   move home carefully so it works best for you.      Planning your transition   Your SCI care team will help you develop your discharge plan. The goal is to help you plan for a living situation that gives you both support and independence. Think about your needs and weigh your options. You will need to make choices about how to adapt your home, transfer your routines, and stay active. With your permission, your family or other caregivers will be invited to meetings to talk about the plan. You will work together with the SCI care team to carry it out.   Things to think about   When planning your return home, consider:   ?? Modifications and equipment. Think about home modifications, such as ramps and other  changes that will make spaces in your home accessible. You may need adaptive equipment such as a hospital bed or tools to help with daily living. Your SCI care team will help you get the equipment you need.   ?? Caregiving. You may need a caregiver to help with certain tasks. This could be a family member, friend, or someone from the community or an agency. The person may be paid or unpaid.   ?? Your health needs. Think about the best ways to take care of your health in your living space. How will your self-care routines work best at home? How will you keep track of your medications? How will you plan and eat meals to be sure you get the nutrition you need? Think about how these and other healthcare needs will work at home.   ?? Your finances. What costs can you cover? What will you need help with? Talk to your social worker about finding resources to help pay for what you need.   ?? Getting around. If you can drive, does your vehicle have the needed adaptations? If you cannot drive, does your neighborhood or city have public transportation that is accessible? Local paratransit services may be able to provide transport. You may decide to drive an adapted vehicle. Or you may have to arrange for a friend, family member, or other caregiver to drive you.   ?? Being active. Think about how you want to spend your time after your transition home. Life is not just about self-care routines. Its important to be active and pursue interests. Do you want to train for a job? Are there local groups youd like to join, or a hobby you want to try? What type of fun and recreation will you do? What do you want your social life to be? Talk about your interests with your SCI care team. They can help you find resources to accomplish your goals for being social and active.   Alternative living situations   You may need more support for a period of time than youll be able to get while living at home. You may want to first transfer to a living  situation that has on-site care. There are many types of places to live that may suit your needs. They range from facilities with 24-hour medical staffing to those with occasional assistance. Or, you may go to the home of a family member or friend who can assist you. A nursing facility has nurses on site around the clock that can handle many medical issues. Assisted-living centers offer help with aspects of daily life, such as housecleaning, meals, errands, and personal care. Licensed board and care homes provide services in a family home setting. Talk   to your SCI care team about what type of living situation might suit you the best.   Support after transition   After your transition home, there is no need to go it alone. There are many people and organizations ready to help you. Your SCI care team will continue to help you after your discharge. Talk to family or friends about the roles they can play in helping you. Look for online and local support groups. Peer counselors are often a great help. You can also get support from outreach staff at local agencies. And you may choose to talk to a counselor. Talk with your SCI care team at any time if you need help finding more support.   Resources   For more information about SCI, go to:   ?? The National Spinal Cord Injury Foundation www.spinalcord.org   ?? [employer] www.christopherreeve.org   ?? [employer] www.pva.org   ?? National Council on Independent Living (NCIL) www.ncil.org   ?? [employer] www.wheelchairnet.org   ?? [employer].edu   ?? [employer] www.unitedspinal.org     ?? 2000-2017 [employer] [address], [place] [address]. All rights reserved. This information is not intended as a substitute for professional medical care. Always follow your healthcare professional's instructions.

## 2018-05-06 NOTE — Care Plan (Signed)
ED Pat [facility]     Patient Education Materials Follows:  ED/Trauma        Spinal Cord Injury: Treatment and Rehabilitation       Physical therapy can help with rehabilitation after a spinal cord injury.    Treatment of a spinal cord injury starts at the place of the accident and continues in the emergency room (ER). Then the injured person will be admitted to the hospital. Or he or she will be moved to a spinal cord injury treatment center.    Treatment   Once vital signs and heart rate are stabilized, there are additional procedures that can be vital in treating spinal cord injuries:   ?? Relief of pressure on the spine. This is done using surgery or traction (a mechanical system of weights).   ?? Treatment to stabilize the spine. Screws, metal plates, and other devices may be placed during surgery. In some cases, traction or a brace may be used instead.   Rehabilitation   First the injury is stabilized, either with surgery, bracing, or both. Then, supportive care and rehabilitation (rehab) are the goals. Supportive care helps prevent other health problems. This may include skin sores. Rehab supports a persons emotional and physical recovery. It includes:   ?? Physical therapy. This supports strength and movement in muscles and joints. It may help some people with spinal cord injuries regain some function.   ?? Counseling. Spinal cord injury can have permanent effects. Counseling helps the injured person and loved ones cope and adjust.   Outlook for the future   It was once believed that damaged nerve cells couldnt be repaired. But recent studies show this may not be true. Now, scientists are searching for ways to regrow injured nerves. The outlook for people with spinal cord injuries is brighter today than ever before.   For more information   To learn more about spinal cord injuries, contact:   National Spinal Cord Injury Association  800-962-9629  www.spinalcord.org     ?? 2000-2017 [employer] [address], [place] [address]. All rights reserved. This information is not intended as a substitute for professional medical care. Always follow your healthcare professional's instructions.     Neurosurgery        Spinal Cord Injury (SCI): Managing Your Bladder   After an SCI, your bladder may not work the same way as before. During your rehabilitation, your healthcare team gave you a bladder program to help you adjust to and manage these changes. Going forward, it will be up to you to follow this program on a regular basis. By taking control of your body and managing your bladder, you will help prevent accidents and complications. This will allow you to stay active and social, and help keep you healthy.        SCI affects your bladder   SCI can damage the nerves involved with bladder function. How your bladder is affected depends on the level and severity of your injury.   ?? An upper level SCI (T12 or higher) may cause a spastic (reflex) bladder. With this type of problem, a reflex is triggered when your bladder is full. It causes your bladder muscles to contract to release urine. You cant tell or feel when this happens. As a result, accidents can happen. Some people with spastic bladder also have problems relaxing the muscles around the bladder (sphincter muscles). This means urine cant be emptied out of the bladder properly.    ??   A lower level SCI (below T12) may cause a flaccid bladder. With this type of problem, your bladder muscles do not contract to release urine when your bladder is full. As a result, your bladder can become too full. This can damage your bladder and lead to other problems.    Your bladder program   The goal of your bladder program is to give you greater control of your bladder function. It allows you to empty your bladder of urine using a method that is safe and effective for you. Some common methods include:   ?? Catheterization. This involves the use of a thin, flexible tube  called a catheter. Once the tube is in place, urine drains through the tube into a bag or collection device. There are many types of catheterization. In some cases, the tube stays in place at all times. In others, the tube is only inserted when it is time to urinate. If you can afford it, getting a bladder scanner is a good idea. The bladder scanner will allow you to know how much urine is in your bladder and when to catheterize yourself (usually when there is more than 350 ml).     ?? Stimulated voiding. This may be used if you still have some control of your bladder. Through various techniques, such as pressing down on your bladder with your fist, you may be able to trigger your bladder muscles to contract and release urine.   ?? Surgery. Various types of surgery may be done to improve the function of your bladder. Or surgery may be done to bypass the bladder and create a new way for urine to leave the body.   Your healthcare provider can tell you more about your choices and help you choose the method that is best for you.   Steps to prevent urinary complications   With any type of bladder problem, there is a higher risk for urinary complications. These can include urinary tract infections (UTIs) and kidney problems. To help protect your urinary tract and stay healthy:   ?? Know the signs and symptoms of a UTI. See below for more information.   ?? Empty your bladder completely and on a regular schedule. A too-full bladder causes leakage. It can also lead to infection or reflux (when urine backs up in the ureters and kidneys). It can trigger a problem called autonomic dysreflexia (AD) as well. This is a sudden spike in blood pressure that must be treated right away. (Ask your healthcare provider if youre at risk for AD.)   ?? Use sterile techniques during catheterization. This helps prevent infection. Wash your hands before and after each catheterization. Also, check that catheters are not blocked or kinked. This may  prevent proper drainage of urine.   ?? Drink plenty of water. Water helps flush bacteria out of your system. This can help prevent UTIs and bladder infections. This can also help prevent bladder and kidney stones.   ?? See your healthcare provider and healthcare team for regular visits. They help monitor your health. If you need to change any part of your bladder care program, let them know right away. Together, you can make adjustments that work for you.   When to call the healthcare provider   Call your healthcare provider right away if you have any of the following signs of a UTI or other bladder issue:   ?? Cloudy or bad-smelling urine   ?? Burning feeling in the urethra or genital area   ??   Aching in the abdomen or back (kidney area)   ?? Increased spasms in the abdomen, legs, or bladder   ?? Fever of 100.4??F (38.0??C) or higher   ?? Chills or sweating   ?? Nausea or vomiting   ?? Feeling very tired   Getting support   You dont have to limit your activities because of your injury or bladder problem. With time and practice, your bladder program will become part of your daily routine. And you can go on doing many of the things youve always enjoyed. This includes working, playing sports, hanging out with friends, and dating. If you need further support, let your healthcare team know. They can refer you to counseling, if needed. Also, reach out to family and friends and let them know how youre feeling. You may find it helpful to join a support group as well. This allows you to talk with other people who are going through similar experiences as you.     ?? 2000-2017 [employer] [address] [place], [place] [address]. All rights reserved. This information is not intended as a substitute for professional medical care. Always follow your healthcare professional's instructions.           Spinal Cord Injury (SCI): Your Transition Home   Leaving a hospital or rehabilitation facility to return to your home after your  injury takes a lot of planning. You, your family, and your SCI care team will work together. This sheet gives you an overview of what to consider before and after your transition. Make sure to play an active role during this time. Its important that you are fully involved in planning your move home carefully so it works best for you.      Planning your transition   Your SCI care team will help you develop your discharge plan. The goal is to help you plan for a living situation that gives you both support and independence. Think about your needs and weigh your options. You will need to make choices about how to adapt your home, transfer your routines, and stay active. With your permission, your family or other caregivers will be invited to meetings to talk about the plan. You will work together with the SCI care team to carry it out.   Things to think about   When planning your return home, consider:   ?? Modifications and equipment. Think about home modifications, such as ramps and other changes that will make spaces in your home accessible. You may need adaptive equipment such as a hospital bed or tools to help with daily living. Your SCI care team will help you get the equipment you need.   ?? Caregiving. You may need a caregiver to help with certain tasks. This could be a family member, friend, or someone from the community or an agency. The person may be paid or unpaid.   ?? Your health needs. Think about the best ways to take care of your health in your living space. How will your self-care routines work best at home? How will you keep track of your medications? How will you plan and eat meals to be sure you get the nutrition you need? Think about how these and other healthcare needs will work at home.   ?? Your finances. What costs can you cover? What will you need help with? Talk to your social worker about finding resources to help pay for what you need.   ?? Getting around. If you can  drive, does your vehicle  have the needed adaptations? If you cannot drive, does your neighborhood or city have public transportation that is accessible? Local paratransit services may be able to provide transport. You may decide to drive an adapted vehicle. Or you may have to arrange for a friend, family member, or other caregiver to drive you.   ?? Being active. Think about how you want to spend your time after your transition home. Life is not just about self-care routines. Its important to be active and pursue interests. Do you want to train for a job? Are there local groups youd like to join, or a hobby you want to try? What type of fun and recreation will you do? What do you want your social life to be? Talk about your interests with your SCI care team. They can help you find resources to accomplish your goals for being social and active.   Alternative living situations   You may need more support for a period of time than youll be able to get while living at home. You may want to first transfer to a living situation that has on-site care. There are many types of places to live that may suit your needs. They range from facilities with 24-hour medical staffing to those with occasional assistance. Or, you may go to the home of a family member or friend who can assist you. A nursing facility has nurses on site around the clock that can handle many medical issues. Assisted-living centers offer help with aspects of daily life, such as housecleaning, meals, errands, and personal care. Licensed board and care homes provide services in a family home setting. Talk to your SCI care team about what type of living situation might suit you the best.   Support after transition   After your transition home, there is no need to go it alone. There are many people and organizations ready to help you. Your SCI care team will continue to help you after your discharge. Talk to family or friends about the roles they can play in helping you. Look for online  and local support groups. Peer counselors are often a great help. You can also get support from outreach staff at local agencies. And you may choose to talk to a counselor. Talk with your SCI care team at any time if you need help finding more support.   Resources   For more information about SCI, go to:   ?? The National Spinal Cord Injury Foundation www.spinalcord.org   ?? Cristal [employer].christopherreeve.org   ?? [employer] [place] http://www.frank.com/   ?? [employer] Independent Living ([employer]) http://www.ball-ray.net/   ?? [employer] www.wheelchairnet.org   ?? [employer].rutgers.edu   ?? United Spinal Association https://www.moore-west.com/     ?? 2000-2017 [employer] [address] [place], [place] [address]. All rights reserved. This information is not intended as a substitute for professional medical care. Always follow your healthcare professional's instructions.

## 2019-02-13 NOTE — Progress Notes (Signed)
PT Time Spent with Patient Acute/OP-Text       PT Time Spent With Patient Outpatient/Acute Entered On:  02/19/2019 9:03 EDT    Performed On:  02/13/2019 11:15 EST by [facility], PTA, JAMIE M               Time Spent With Patient   PT Minutes Grid     PT Minutes Lost #1          Reason :    Illness  (Comment: Pt. calling to cancel CSCI appointment secondary to illness. Will call to schedule. [[facility], PTA, JAMIE M - 02/19/2019 9:02 EDT] )               [facility]RIC, PTA, JAMIE M - 02/19/2019 9:02 EDT

## 2019-04-30 NOTE — Progress Notes (Signed)
Outpatient OT Certification Letter-Text       OT Outpatient Certification Letter Entered On:  05/06/2019 15:06 EDT    Performed On:  04/30/2019 15:06 EDT by Roby Lofts               Physician Certification   Date of Injury or Surgery :   03/22/2018 EDT     Ordering Physician Name :   Volney Presser     Number of Visits This Interval :   0     Dear Physician :   Thank you for your referral. Below is the patient information for the stated interval of treatment. Please review, modify (if necessary), sign, and return. Thank you.     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 )  Name of Problem:   Adrenal hemorrhage ; Recorder:   [name]; Confirmation:   Confirmed ; Classification:   Patient Stated ; Code:   [id number] ; Contributor System:   PowerChart ; Last Updated:   04/09/2018 12:30 EDT ; Life Cycle Date:   04/09/2018 ; Life Cycle Status:   Active ; Vocabulary:   SNOMED CT        Bursitis (SNOMED CT  :427062376 )  Name of Problem:   Bursitis ; Recorder:   [name]; Confirmation:   Confirmed ; Classification:   Patient Stated ; Code:   283151761 ; Contributor System:   PowerChart ; Last Updated:   04/09/2018 12:31 EDT ; Life Cycle Date:   04/09/2018 ; Life Cycle Status:   Active ; Vocabulary:   SNOMED CT        Closed fracture of symphysis pubis with diastasis (SNOMED CT  :607371062 )  Name of Problem:   Closed fracture of symphysis pubis with diastasis ; Recorder:   [name]; Confirmation:   Confirmed ; Classification:   Patient Stated ; Code:   694854627 ; Contributor System:   Dietitian ; Last Updated:   04/09/2018 12:30 EDT ; Life Cycle Date:   04/09/2018 ; Life Cycle Status:   Active ; Vocabulary:   SNOMED CT        Impaired mobility (SNOMED CT  :035009381 )  Name of  Problem:   Impaired mobility ; Onset Date:   04/11/2018 ; Recorder:   SYSTEM,  SYSTEM; Confirmation:   Confirmed ; Classification:   Interdisciplinary ; Code:   829937169 ; Last Updated:   04/11/2018 16:13 EDT ; Life Cycle Date:   04/11/2018 ; Life Cycle Status:   Active ; Vocabulary:   SNOMED CT   ; Comments:        04/11/2018 16:13 - SYSTEM,  SYSTEM  Problem added by Discern Expert      Left ulnar fracture (SNOMED CT  :67893810 )  Name of Problem:   Left ulnar fracture ; Recorder:   [name]; Confirmation:   Confirmed ; Classification:   Patient Stated ; Code:   17510258 ; Contributor System:   Dietitian ; Last Updated:   04/09/2018 12:31 EDT ; Life Cycle Date:   04/09/2018 ; Life Cycle Status:   Active ; Vocabulary:   SNOMED CT  Lower back pain (SNOMED CT  :914782956 )  Name of Problem:   Lower back pain ; Recorder:   [name]; Confirmation:   Confirmed ; Classification:   Patient Stated ; Code:   213086578 ; Contributor System:   PowerChart ; Last Updated:   04/09/2018 12:29 EDT ; Life Cycle Date:   04/09/2018 ; Life Cycle Status:   Active ; Vocabulary:   SNOMED CT        Lower extremity paralysis (SNOMED CT  :469629528 )  Name of Problem:   Lower extremity paralysis ; Recorder:   [name]; Confirmation:   Confirmed ; Classification:   Patient Stated ; Code:   413244010 ; Contributor System:   PowerChart ; Last Updated:   04/09/2018 12:29 EDT ; Life Cycle Date:   04/09/2018 ; Life Cycle Status:   Active ; Vocabulary:   SNOMED CT        Lung laceration (SNOMED CT  :272536644 )  Name of Problem:   Lung laceration ; Recorder:   [name]; Confirmation:   Confirmed ; Classification:   Patient Stated ; Code:   034742595 ; Contributor System:   Dietitian ; Last Updated:   04/09/2018 12:32 EDT ; Life Cycle Date:   04/09/2018 ; Life Cycle Status:   Active ; Vocabulary:   SNOMED CT        Moderate protein-calorie malnutrition (SNOMED CT  :[id number] )  Name of  Problem:   Moderate protein-calorie malnutrition ; Recorder:   Fulton Mole; Confirmation:   Confirmed ; Classification:   Medical ; Code:   [id number] ; Contributor System:   PowerChart ; Last Updated:   04/18/2018 11:58 EDT ; Life Cycle Date:   04/18/2018 ; Life Cycle Status:   Active ; Responsible Provider:   Volney Presser; Vocabulary:   SNOMED CT   ; Comments:        04/18/2018 11:58 - Rickey Barbara B  Moderate malnutrition related to unintentional weight loss as evidenced by 11% weight loss <3 months, moderate muscle loss of calf region, patellar region, varied PO intakes, decrease in functional ability/ADLs given paraplegia after [facility] accident.      Paraplegia (SNOMED CT  :557322025 )  Name of Problem:   Paraplegia ; Recorder:   [name]; Confirmation:   Confirmed ; Classification:   Patient Stated ; Code:   427062376 ; Contributor System:   Dietitian ; Last Updated:   04/09/2018 12:31 EDT ; Life Cycle Date:   04/09/2018 ; Life Cycle Status:   Active ; Vocabulary:   SNOMED CT        Rhabdomyolysis (SNOMED CT  :283151761 )  Name of Problem:   Rhabdomyolysis ; Recorder:   [name]; Confirmation:   Confirmed ; Classification:   Patient Stated ; Code:   607371062 ; Contributor System:   Dietitian ; Last Updated:   04/09/2018 12:32 EDT ; Life Cycle Date:   04/09/2018 ; Life Cycle Status:   Active ; Vocabulary:   SNOMED CT        Ribs, multiple fractures (SNOMED CT  :6948546 )  Name of Problem:   Ribs, multiple fractures ; Recorder:   [name]; Confirmation:   Confirmed ; Classification:   Patient Stated ; Code:   2703500 ; Contributor System:   Dietitian ; Last Updated:   04/09/2018 12:31 EDT ; Life Cycle Date:   04/09/2018 ; Life Cycle  Status:   Active ; Vocabulary:   SNOMED CT        Spinal cord injury at T7-T12 level (SNOMED CT  :6301601093 )  Name of Problem:   Spinal cord injury at T7-T12 level ; Recorder:   [name]; Confirmation:    Confirmed ; Classification:   Patient Stated ; Code:   2355732202 ; Contributor System:   Dietitian ; Last Updated:   04/09/2018 12:29 EDT ; Life Cycle Date:   04/09/2018 ; Life Cycle Status:   Active ; Vocabulary:   SNOMED CT        Sprain of sacroiliac joint (SNOMED CT  :542706237 )  Name of Problem:   Sprain of sacroiliac joint ; Recorder:   [name]; Confirmation:   Confirmed ; Classification:   Patient Stated ; Code:   628315176 ; Contributor System:   Dietitian ; Last Updated:   04/09/2018 12:30 EDT ; Life Cycle Date:   04/09/2018 ; Life Cycle Status:   Active ; Vocabulary:   SNOMED CT        Stable burst fracture of T11 vertebra (SNOMED CT  :160737106 )  Name of Problem:   Stable burst fracture of T11 vertebra ; Recorder:   [name]; Confirmation:   Confirmed ; Classification:   Patient Stated ; Code:   269485462 ; Contributor System:   PowerChart ; Last Updated:   04/09/2018 12:30 EDT ; Life Cycle Date:   04/09/2018 ; Life Cycle Status:   Active ; Vocabulary:   SNOMED CT          Diagnoses(Active)    Impaired mobility  Date:   04/30/2019 ; Diagnosis Type:   Admitting ; Confirmation:   Confirmed ; Clinical Dx:   Impaired mobility ; Classification:   Interdisciplinary ; Clinical Service:   Non-Specified ; Code:   ICD-10-CM ; Probability:   0 ; Diagnosis Code:   Z74.09        Plan   OT Anticipated Treatments, Needs :   Discharge/Discontinue OT treatment     OT Certification Letter Complete :   Yes   [name], OT, ELIZABETH G - 05/06/2019 15:07 EDT     Outpatient Review   OT Clinical Assessment Summary :   Pt presents with girlfriend Bobbi, and reporting pain in shoulder for which MD administered steroid shot.  Discussed continued perormance of STOMPs protocol to improve pain free shoulder mobility.  Pt and girlfriend also indicate that pt's had increased edema and shortness of breath as day progresses.  MD made aware and referal made to cardiology for follow up.  Pt and girlfriend indicate that ADL  and IADL tasks continue to get easier.  Pt continues to i/o cath and perform bowel program every other day.  Pt did have signficant wound on L heel, however this is healing well.  RN made aware to ensure proper dressing.  Pt and girlfriend have no other questions or OT needs at this time.         Rehab Potential Occupational Therapy :   Robbie Lis - 05/06/2019 15:07 EDT     Prior Functional Level Grid   ADL :   Independent     Mobility :   Independent     Instrumental ADL :   Independent     Cognitive-Communication Skills :   Merri Brunette, OTGardiner Ramus - 05/06/2019 15:07 EDT     OT Impairments or  Limitations :   Pain   Roby Lofts - 05/06/2019 15:07 EDT      Signature Line                                                 Electronically Signed On 05/06/19 03:06 PM                                               _________________________________________________                                               Roby Lofts                                                     Electronically Signed On 05/06/19 03:06 PM                                               _________________________________________________                                                Roby Lofts                                                     Electronically Signed On 05/07/19 09:39 AM                                               _________________________________________________                                                Lowell Guitar MD, Elmon Else                  Dicatation Date: 05/06/19 03:06 PM
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
## 2020-03-11 NOTE — Progress Notes (Signed)
Outpatient PT Certification Letter-Text       PT Outpatient Certification Letter Entered On:  03/18/2020 11:26 EDT    Performed On:  03/11/2020 11:25 EDT by Keane Police PT, Amber               Physician Certification   Date of Injury or Surgery :   03/18/2020 EDT   Ordering Physician Name :   Maryella Shivers J-MD   Number of Visits This Interval :   1   Dear Physician :   Thank you for your referral. Below is the patient information for the stated interval of treatment. Please review, modify (if necessary), sign, and return. Thank you.   Date of Evaluation :   03/11/2020 EDT   PT Certification Interval End :   03/11/2020 EDT   Physician Signature Required :   Yes   Staff Physician Signature :   The physician's electronic signature noted above indicates approval of the documented Plan of Care for the stated interval.   Hegeman, PT, Amber - 03/18/2020 11:25 EDT   Plan   PT Anticipated Treatments, Needs :   Wheelchair assessment and management   PT Certification Letter Complete :   Yes   Hegeman, PT, Amber - 03/18/2020 11:25 EDT   Outpatient Review   *Clinical Assessment Summary :   Pt demonstrates improved seated position and posture following fitting of custom group 3 PWC. He requires use of PWC to complete all mobility related activites of daily living inside of his home due to paraplegia and new onsed L UE fractures, surgery and prolonged NWB. Plan to order lateral trunk support for R to decrease R lateral lean and sustain midline sitting position. Follow up in seating clinic should further issues arise with PWC.      Hegeman, PT, Amber - 03/18/2020 11:25 EDT   Prior Functional Level Grid   ADL :   Assist needed   Mobility :   Assist needed   Instrumental ADL :   Assist needed   Cognitive-Communication Skills :   Independent   Keane Police, PT, Amber - 03/18/2020 11:25 EDT    Signature Line                                                 Electronically Signed On 03/18/20 11:25 AM                                                _________________________________________________                                               Lorra Hals, Amber                                                     Electronically Signed On 03/18/20 08:21 PM  _________________________________________________                                                Lowell Guitar MD, Elmon Else                  Dicatation Date: 03/18/20 11:25 AM

## 2021-04-04 ENCOUNTER — Telehealth: Payer: Self-pay | Admitting: *Deleted
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
## 2021-04-04 NOTE — Telephone Encounter (Signed)
Entered in error, disregard

## 2025-01-15 ENCOUNTER — Ambulatory Visit: Payer: MEDICAID
